# Patient Record
Sex: Female | Born: 1935 | Race: Black or African American | Hispanic: No | Marital: Single | Smoking: Former smoker
Health system: Southern US, Community
[De-identification: ages and names within clinical notes are randomized; demographics above are authoritative.]

## PROBLEM LIST (undated history)

## (undated) DIAGNOSIS — I2510 Atherosclerotic heart disease of native coronary artery without angina pectoris: Secondary | ICD-10-CM

## (undated) DIAGNOSIS — M545 Low back pain, unspecified: Secondary | ICD-10-CM

## (undated) DIAGNOSIS — M1990 Unspecified osteoarthritis, unspecified site: Secondary | ICD-10-CM

## (undated) DIAGNOSIS — N259 Disorder resulting from impaired renal tubular function, unspecified: Secondary | ICD-10-CM

## (undated) DIAGNOSIS — R51 Headache: Secondary | ICD-10-CM

## (undated) DIAGNOSIS — K589 Irritable bowel syndrome without diarrhea: Secondary | ICD-10-CM

## (undated) DIAGNOSIS — F0390 Unspecified dementia without behavioral disturbance: Secondary | ICD-10-CM

## (undated) DIAGNOSIS — G510 Bell's palsy: Secondary | ICD-10-CM

## (undated) DIAGNOSIS — K5730 Diverticulosis of large intestine without perforation or abscess without bleeding: Secondary | ICD-10-CM

## (undated) DIAGNOSIS — K449 Diaphragmatic hernia without obstruction or gangrene: Secondary | ICD-10-CM

## (undated) DIAGNOSIS — I10 Essential (primary) hypertension: Secondary | ICD-10-CM

## (undated) DIAGNOSIS — D649 Anemia, unspecified: Secondary | ICD-10-CM

## (undated) DIAGNOSIS — G459 Transient cerebral ischemic attack, unspecified: Secondary | ICD-10-CM

## (undated) DIAGNOSIS — E119 Type 2 diabetes mellitus without complications: Secondary | ICD-10-CM

## (undated) HISTORY — DX: Type 2 diabetes mellitus without complications: E11.9

## (undated) HISTORY — DX: Diverticulosis of large intestine without perforation or abscess without bleeding: K57.30

## (undated) HISTORY — DX: Atherosclerotic heart disease of native coronary artery without angina pectoris: I25.10

## (undated) HISTORY — PX: HIATAL HERNIA REPAIR: SHX195

## (undated) HISTORY — DX: Anemia, unspecified: D64.9

## (undated) HISTORY — DX: Unspecified osteoarthritis, unspecified site: M19.90

## (undated) HISTORY — DX: Transient cerebral ischemic attack, unspecified: G45.9

## (undated) HISTORY — DX: Irritable bowel syndrome without diarrhea: K58.9

## (undated) HISTORY — DX: Bell's palsy: G51.0

## (undated) HISTORY — DX: Diaphragmatic hernia without obstruction or gangrene: K44.9

## (undated) HISTORY — DX: Essential (primary) hypertension: I10

## (undated) HISTORY — DX: Unspecified dementia, unspecified severity, without behavioral disturbance, psychotic disturbance, mood disturbance, and anxiety: F03.90

## (undated) HISTORY — DX: Headache: R51

## (undated) HISTORY — DX: Disorder resulting from impaired renal tubular function, unspecified: N25.9

## (undated) HISTORY — DX: Low back pain: M54.5

## (undated) HISTORY — PX: CHOLECYSTECTOMY: SHX55

## (undated) HISTORY — DX: Low back pain, unspecified: M54.50

---

## 1997-09-22 ENCOUNTER — Emergency Department (HOSPITAL_COMMUNITY): Admission: EM | Admit: 1997-09-22 | Discharge: 1997-09-22 | Payer: Self-pay | Admitting: Internal Medicine

## 1998-04-05 ENCOUNTER — Inpatient Hospital Stay (HOSPITAL_COMMUNITY): Admission: AD | Admit: 1998-04-05 | Discharge: 1998-04-06 | Payer: Self-pay | Admitting: Cardiology

## 1999-11-28 ENCOUNTER — Emergency Department (HOSPITAL_COMMUNITY): Admission: EM | Admit: 1999-11-28 | Discharge: 1999-11-29 | Payer: Self-pay | Admitting: Emergency Medicine

## 1999-11-28 ENCOUNTER — Encounter: Payer: Self-pay | Admitting: Emergency Medicine

## 1999-11-29 ENCOUNTER — Encounter: Payer: Self-pay | Admitting: Emergency Medicine

## 2004-02-12 ENCOUNTER — Encounter: Admission: RE | Admit: 2004-02-12 | Discharge: 2004-03-10 | Payer: Self-pay | Admitting: Orthopedic Surgery

## 2004-05-17 ENCOUNTER — Ambulatory Visit: Payer: Self-pay | Admitting: Pulmonary Disease

## 2004-06-14 ENCOUNTER — Ambulatory Visit: Payer: Self-pay | Admitting: Pulmonary Disease

## 2004-08-29 ENCOUNTER — Ambulatory Visit: Payer: Self-pay | Admitting: Pulmonary Disease

## 2004-10-04 ENCOUNTER — Ambulatory Visit: Payer: Self-pay | Admitting: Pulmonary Disease

## 2005-07-04 ENCOUNTER — Ambulatory Visit: Payer: Self-pay | Admitting: Pulmonary Disease

## 2005-07-04 ENCOUNTER — Ambulatory Visit (HOSPITAL_COMMUNITY): Admission: RE | Admit: 2005-07-04 | Discharge: 2005-07-04 | Payer: Self-pay | Admitting: Pulmonary Disease

## 2005-07-25 ENCOUNTER — Ambulatory Visit: Payer: Self-pay | Admitting: Pulmonary Disease

## 2005-09-27 ENCOUNTER — Encounter: Admission: RE | Admit: 2005-09-27 | Discharge: 2005-09-27 | Payer: Self-pay | Admitting: Pulmonary Disease

## 2005-10-03 ENCOUNTER — Ambulatory Visit: Payer: Self-pay | Admitting: Pulmonary Disease

## 2005-11-13 ENCOUNTER — Ambulatory Visit: Payer: Self-pay | Admitting: Pulmonary Disease

## 2006-03-29 ENCOUNTER — Ambulatory Visit: Payer: Self-pay | Admitting: Pulmonary Disease

## 2006-05-30 ENCOUNTER — Ambulatory Visit: Payer: Self-pay | Admitting: Pulmonary Disease

## 2006-05-30 LAB — CONVERTED CEMR LAB
BUN: 10 mg/dL (ref 6–23)
CO2: 23 meq/L (ref 19–32)
GFR calc Af Amer: 61 mL/min
GFR calc non Af Amer: 51 mL/min
Potassium: 3.1 meq/L — ABNORMAL LOW (ref 3.5–5.1)

## 2006-12-26 ENCOUNTER — Ambulatory Visit: Payer: Self-pay | Admitting: Pulmonary Disease

## 2006-12-26 LAB — CONVERTED CEMR LAB
BUN: 39 mg/dL — ABNORMAL HIGH (ref 6–23)
CO2: 23 meq/L (ref 19–32)
GFR calc Af Amer: 20 mL/min
GFR calc non Af Amer: 17 mL/min
Glucose, Bld: 146 mg/dL — ABNORMAL HIGH (ref 70–99)
Potassium: 3.5 meq/L (ref 3.5–5.1)

## 2007-01-07 ENCOUNTER — Ambulatory Visit: Payer: Self-pay | Admitting: Pulmonary Disease

## 2007-01-07 LAB — CONVERTED CEMR LAB
BUN: 15 mg/dL (ref 6–23)
CO2: 26 meq/L (ref 19–32)
GFR calc Af Amer: 61 mL/min
Glucose, Bld: 204 mg/dL — ABNORMAL HIGH (ref 70–99)
Potassium: 3.6 meq/L (ref 3.5–5.1)

## 2007-03-13 DIAGNOSIS — M545 Low back pain: Secondary | ICD-10-CM

## 2007-03-13 DIAGNOSIS — I2510 Atherosclerotic heart disease of native coronary artery without angina pectoris: Secondary | ICD-10-CM

## 2007-03-13 DIAGNOSIS — K449 Diaphragmatic hernia without obstruction or gangrene: Secondary | ICD-10-CM | POA: Insufficient documentation

## 2007-03-13 DIAGNOSIS — I10 Essential (primary) hypertension: Secondary | ICD-10-CM | POA: Insufficient documentation

## 2007-03-13 DIAGNOSIS — E7800 Pure hypercholesterolemia, unspecified: Secondary | ICD-10-CM

## 2007-03-13 DIAGNOSIS — K589 Irritable bowel syndrome without diarrhea: Secondary | ICD-10-CM

## 2007-03-13 DIAGNOSIS — D649 Anemia, unspecified: Secondary | ICD-10-CM | POA: Insufficient documentation

## 2007-03-13 DIAGNOSIS — M1990 Unspecified osteoarthritis, unspecified site: Secondary | ICD-10-CM

## 2007-03-13 DIAGNOSIS — Z87898 Personal history of other specified conditions: Secondary | ICD-10-CM

## 2007-03-13 DIAGNOSIS — G459 Transient cerebral ischemic attack, unspecified: Secondary | ICD-10-CM | POA: Insufficient documentation

## 2007-03-14 ENCOUNTER — Ambulatory Visit: Payer: Self-pay | Admitting: Pulmonary Disease

## 2007-03-22 ENCOUNTER — Ambulatory Visit (HOSPITAL_COMMUNITY): Admission: RE | Admit: 2007-03-22 | Discharge: 2007-03-22 | Payer: Self-pay | Admitting: Pulmonary Disease

## 2007-04-29 ENCOUNTER — Encounter: Payer: Self-pay | Admitting: Pulmonary Disease

## 2007-07-11 ENCOUNTER — Encounter: Payer: Self-pay | Admitting: Pulmonary Disease

## 2007-07-12 ENCOUNTER — Encounter: Payer: Self-pay | Admitting: Pulmonary Disease

## 2007-07-16 ENCOUNTER — Ambulatory Visit: Payer: Self-pay | Admitting: Pulmonary Disease

## 2007-07-16 DIAGNOSIS — K5730 Diverticulosis of large intestine without perforation or abscess without bleeding: Secondary | ICD-10-CM

## 2007-07-16 DIAGNOSIS — N259 Disorder resulting from impaired renal tubular function, unspecified: Secondary | ICD-10-CM | POA: Insufficient documentation

## 2007-07-16 DIAGNOSIS — F0390 Unspecified dementia without behavioral disturbance: Secondary | ICD-10-CM

## 2007-08-04 LAB — CONVERTED CEMR LAB
ALT: 14 units/L (ref 0–35)
AST: 26 units/L (ref 0–37)
Basophils Absolute: 0 10*3/uL (ref 0.0–0.1)
Bilirubin, Direct: 0.1 mg/dL (ref 0.0–0.3)
CO2: 27 meq/L (ref 19–32)
Chloride: 111 meq/L (ref 96–112)
Lymphocytes Relative: 31.8 % (ref 12.0–46.0)
MCHC: 32.7 g/dL (ref 30.0–36.0)
Neutrophils Relative %: 58.4 % (ref 43.0–77.0)
Potassium: 3.5 meq/L (ref 3.5–5.1)
RDW: 12.6 % (ref 11.5–14.6)
Sodium: 145 meq/L (ref 135–145)
TSH: 1.31 microintl units/mL (ref 0.35–5.50)
Total Bilirubin: 0.7 mg/dL (ref 0.3–1.2)

## 2007-08-23 ENCOUNTER — Encounter: Payer: Self-pay | Admitting: Pulmonary Disease

## 2008-01-03 ENCOUNTER — Telehealth (INDEPENDENT_AMBULATORY_CARE_PROVIDER_SITE_OTHER): Payer: Self-pay | Admitting: *Deleted

## 2008-01-03 DIAGNOSIS — R32 Unspecified urinary incontinence: Secondary | ICD-10-CM

## 2008-01-20 ENCOUNTER — Encounter: Payer: Self-pay | Admitting: Pulmonary Disease

## 2008-01-22 ENCOUNTER — Encounter: Payer: Self-pay | Admitting: Adult Health

## 2008-01-22 ENCOUNTER — Ambulatory Visit: Payer: Self-pay | Admitting: Pulmonary Disease

## 2008-01-22 LAB — CONVERTED CEMR LAB
Ketones, ur: NEGATIVE mg/dL
Mucus, UA: NEGATIVE
RBC / HPF: NONE SEEN
Specific Gravity, Urine: >=1.03 (ref 1.000–1.03)
pH: 6 (ref 5.0–8.0)

## 2008-02-05 ENCOUNTER — Encounter: Payer: Self-pay | Admitting: Adult Health

## 2008-02-28 ENCOUNTER — Telehealth (INDEPENDENT_AMBULATORY_CARE_PROVIDER_SITE_OTHER): Payer: Self-pay | Admitting: *Deleted

## 2008-03-03 ENCOUNTER — Ambulatory Visit: Payer: Self-pay | Admitting: Internal Medicine

## 2008-03-17 ENCOUNTER — Encounter: Payer: Self-pay | Admitting: Pulmonary Disease

## 2008-04-21 ENCOUNTER — Inpatient Hospital Stay (HOSPITAL_COMMUNITY): Admission: EM | Admit: 2008-04-21 | Discharge: 2008-04-22 | Payer: Self-pay | Admitting: Emergency Medicine

## 2008-04-21 ENCOUNTER — Ambulatory Visit: Payer: Self-pay | Admitting: Pulmonary Disease

## 2008-04-27 ENCOUNTER — Telehealth: Payer: Self-pay | Admitting: Pulmonary Disease

## 2008-05-01 ENCOUNTER — Ambulatory Visit: Payer: Self-pay | Admitting: Pulmonary Disease

## 2008-05-01 DIAGNOSIS — G510 Bell's palsy: Secondary | ICD-10-CM

## 2008-05-11 ENCOUNTER — Telehealth (INDEPENDENT_AMBULATORY_CARE_PROVIDER_SITE_OTHER): Payer: Self-pay | Admitting: *Deleted

## 2008-06-04 ENCOUNTER — Telehealth: Payer: Self-pay | Admitting: Pulmonary Disease

## 2008-06-04 ENCOUNTER — Encounter: Payer: Self-pay | Admitting: Pulmonary Disease

## 2008-07-02 ENCOUNTER — Telehealth (INDEPENDENT_AMBULATORY_CARE_PROVIDER_SITE_OTHER): Payer: Self-pay | Admitting: *Deleted

## 2008-07-07 ENCOUNTER — Encounter: Payer: Self-pay | Admitting: Pulmonary Disease

## 2008-07-20 ENCOUNTER — Encounter: Payer: Self-pay | Admitting: Pulmonary Disease

## 2008-08-12 ENCOUNTER — Encounter: Payer: Self-pay | Admitting: Pulmonary Disease

## 2008-10-29 ENCOUNTER — Encounter: Payer: Self-pay | Admitting: Pulmonary Disease

## 2008-12-15 ENCOUNTER — Ambulatory Visit: Payer: Self-pay | Admitting: Pulmonary Disease

## 2008-12-16 ENCOUNTER — Telehealth: Payer: Self-pay | Admitting: Pulmonary Disease

## 2008-12-16 ENCOUNTER — Emergency Department (HOSPITAL_COMMUNITY): Admission: EM | Admit: 2008-12-16 | Discharge: 2008-12-17 | Payer: Self-pay | Admitting: Emergency Medicine

## 2009-07-09 ENCOUNTER — Encounter (INDEPENDENT_AMBULATORY_CARE_PROVIDER_SITE_OTHER): Payer: Self-pay | Admitting: *Deleted

## 2009-07-27 ENCOUNTER — Ambulatory Visit: Payer: Self-pay | Admitting: Pulmonary Disease

## 2009-07-27 LAB — CONVERTED CEMR LAB
ALT: 13 units/L (ref 0–35)
BUN: 16 mg/dL (ref 6–23)
Basophils Relative: 0.5 % (ref 0.0–3.0)
Chloride: 113 meq/L — ABNORMAL HIGH (ref 96–112)
Eosinophils Relative: 1.5 % (ref 0.0–5.0)
HCT: 34.6 % — ABNORMAL LOW (ref 36.0–46.0)
Hemoglobin: 11.8 g/dL — ABNORMAL LOW (ref 12.0–15.0)
Hgb A1c MFr Bld: 5.4 % (ref 4.6–6.5)
Lymphs Abs: 1.5 10*3/uL (ref 0.7–4.0)
MCV: 92 fL (ref 78.0–100.0)
Monocytes Absolute: 0.3 10*3/uL (ref 0.1–1.0)
Neutro Abs: 3.3 10*3/uL (ref 1.4–7.7)
Potassium: 3.4 meq/L — ABNORMAL LOW (ref 3.5–5.1)
RBC: 3.76 M/uL — ABNORMAL LOW (ref 3.87–5.11)
Sodium: 148 meq/L — ABNORMAL HIGH (ref 135–145)
TSH: 1.22 microintl units/mL (ref 0.35–5.50)
Total Protein: 7 g/dL (ref 6.0–8.3)
WBC: 5.2 10*3/uL (ref 4.5–10.5)

## 2010-01-26 ENCOUNTER — Ambulatory Visit: Payer: Self-pay | Admitting: Pulmonary Disease

## 2010-02-02 LAB — CONVERTED CEMR LAB
BUN: 19 mg/dL (ref 6–23)
Calcium: 9.9 mg/dL (ref 8.4–10.5)
Creatinine, Ser: 0.8 mg/dL (ref 0.4–1.2)
Hgb A1c MFr Bld: 5.8 % (ref 4.6–6.5)

## 2010-03-03 ENCOUNTER — Encounter: Payer: Self-pay | Admitting: Pulmonary Disease

## 2010-03-18 ENCOUNTER — Telehealth (INDEPENDENT_AMBULATORY_CARE_PROVIDER_SITE_OTHER): Payer: Self-pay | Admitting: *Deleted

## 2010-05-24 NOTE — Assessment & Plan Note (Signed)
Summary: rov/ 6 months/ mss   CC:  21 month ROV & review of mult medical problems....  History of Present Illness: 77 y/o BF here for a follow up visit... she has mult med problems as noted below...    ~  Jan10:  she has no specific complaints or concerns- "I'm doing OK"... she has a severe dementia- stable... family notes that she complains of HA on occas, then forgets about it... she was adm to observ 12/29 after presenting w/ right facial symptoms that ER doc thought was TIA... eval revealed prob Bell's Palsey & confirmed by DrWillis... she was treated w/ Acyclovir orally and returns for f/u assessment...  she is improved after the Acyclovir Rx... min residual right facial weakness noted w/ smile etc... eye closes completely etc... sensation appears intact...   ~  January 26, 2010:  21 mo ROV- but saw NP 4/11 for refill meds, doing well... they have home health nurse 5d per week for 7H- requires assist w/ all ADLs, she is incontinent, still feeds self, no behavioral issues...  BP controlled on meds; no CP, palpit, ch in SOB, etc;  DM control a bit tight on her Glucovance & we will decr to 1/2 tab in AM only;  uses Tramadol for arthritic complaints...     Current Problems:    STRABISMUS - she has a remarkable strabismus, but vision seems adeq per family hx & given her severe underlying dementia they have decided not to pursue this prob w/ ophthalmology.  HYPERTENSION (ICD-401.9) - on LABETOLOL 200mg Bid, NORVASC 10mg /d, off Lasix & KCl...  ~  BP today = 140/80 & tolerates meds well... denies HA, fatigue, visual changes, CP, palipit, dizziness, syncope, dyspnea, edema, etc... but as noted she has dementia and denies all symptoms.  CAD (ICD-414.00)  ~  Persantine Cardiolite 3/98 was norm w/o ischemia or infarct, EF=72%...  ~  cath 12/99 showed Ao root dil, 2 vessel non-obstructive CAD w/ 20-30% lesions, EF=70%...  HYPERCHOLESTEROLEMIA (ICD-272.0) - prev on Zetia, now on diet alone...  family  unable to bring her into the office for FASTING blood work.  DM (ICD-250.00) - on GLUCOVANCE 2.5-500 Bid... + low carb/ no sweets diet...  ~  labs 3/09 showed BS= 96, HgA1c= 6.0.  ~  labs 12/09 in hosp showed BS= 122, HgA1c= 6.2.  ~  labs 10/11 showed BS= 106, A1c= 5.8.Marland KitchenMarland Kitchen this is prob too tight control & rec to decr Glucovance to 1/2 tab Qam only.  HIATAL HERNIA (ICD-553.3) - no longer taking her PPI therapy- prev Nexium Rx...  ~  last EGD was 11/96 by DrBrodie showing deformed pylorus c/w chr PUD, some gastric retension.  ~  Ba swallow 11/08 was essent neg- sm HH, no reflux...  DIVERTICULOSIS OF COLON (ICD-562.10) - prev Rx w/ Levsin, Metamucil...  ~  last colonoscopy was 2/04 by DrBrodie showing divertics, no polyps...   IRRITABLE BOWEL SYNDROME (ICD-564.1)  Hx of RENAL INSUFFICIENCY (ICD-588.9)  ~  labs 3/09 showed BUN= 15, Creat= 0.8  ~  labs 12/09 showed BUN= 23, Creat= 0.88  ~  labs 10/11 showed BUN= 19, Creat= 0.8  DEGENERATIVE JOINT DISEASE (ICD-715.90) - on TRAMADOL Prn...  BACK PAIN, LUMBAR (ICD-724.2)  TIA (ICD-435.9) - on ASA 81mg /d...   ~  CT Brain 12/09 showed atherosclerotic changes, atrophy, NAD...  Hx of HEADACHE (ICD-784.0) -  prev Rx w/ on Neurontin 300Qhs... eval by DrWillis 8/08...   SENILE DEMENTIA (ICD-290.0) - on ARICEPT 10mg /d & NAMENDA 10mg Bid... family cares for  pt at home and they do a beautiful job... they have to put meds in her food otherw she won't take them...  Hx of BELL'S PALSY, RIGHT (ICD-351.0) - ** see above ** Hosp overnight 04/21/08 w/ right facial weakness c/w Bell's Palsey... Rx w/ Acyclovir and improved...  SHINGLES, HX OF (ICD-V13.8)  ANEMIA (ICD-285.9)   Preventive Screening-Counseling & Management  Alcohol-Tobacco     Smoking Status: quit  Comments: quit date unknown per pt---she did smoke 1/2 ppd  Allergies: 1)  ! Lipitor 2)  ! * Actos  Comments:  Nurse/Medical Assistant: The patient's medications and allergies were  reviewed with the patient and were updated in the Medication and Allergy Lists.  Past History:  Past Medical History: HYPERTENSION (ICD-401.9) CAD (ICD-414.00) HYPERCHOLESTEROLEMIA (ICD-272.0) DM (ICD-250.00) HIATAL HERNIA (ICD-553.3) DIVERTICULOSIS OF COLON (ICD-562.10) IRRITABLE BOWEL SYNDROME (ICD-564.1) Hx of RENAL INSUFFICIENCY (ICD-588.9) DEGENERATIVE JOINT DISEASE (ICD-715.90) BACK PAIN, LUMBAR (ICD-724.2) TIA (ICD-435.9) Hx of HEADACHE (ICD-784.0) SENILE DEMENTIA (ICD-290.0) Hx of BELL'S PALSY, RIGHT (ICD-351.0) ANEMIA (ICD-285.9) SHINGLES, HX OF (ICD-V13.8)  Past Surgical History: S/P cholecystectomy  Family History: Reviewed history from 07/27/2009 and no changes required. mother died of CVA age 99 father died of unknown cause 2 brothers otherwise significant for hypertension, diabetes and heart disease  Social History: Reviewed history from 07/27/2009 and no changes required. lives at home, w/ son 9 children no alcohol  former smoker of 1/2ppd, quit date unknown widowed high school education  Smoking Status:  quit  Review of Systems      See HPI       The patient complains of decreased hearing, dyspnea on exertion, muscle weakness, and difficulty walking.  The patient denies anorexia, fever, weight loss, weight gain, vision loss, hoarseness, chest pain, syncope, peripheral edema, prolonged cough, headaches, hemoptysis, abdominal pain, melena, hematochezia, severe indigestion/heartburn, hematuria, incontinence, suspicious skin lesions, transient blindness, depression, unusual weight change, abnormal bleeding, enlarged lymph nodes, and angioedema.    Vital Signs:  Patient profile:   77 year old female Height:      60 inches O2 Sat:      96 % on Room air Temp:     97.7 degrees F oral Pulse rate:   60 / minute BP sitting:   140 / 80  (left arm) Cuff size:   regular  Vitals Entered By: Randell Loop CMA (January 26, 2010 11:13 AM)  O2 Sat at Rest %:   96 O2 Flow:  Room air CC: 21 month ROV & review of mult medical problems... Is Patient Diabetic? Yes Pain Assessment Patient in pain? yes      Onset of pain  lower abd pain at times Comments no changes in meds today---pts caregiver brought all of her meds today   Physical Exam  Additional Exam:  WD, Overweight, 77 y/o BF in NAD... GENERAL:  Alert & cooperative... she has an obvious dementia... HEENT:  [place]/AT, marked strabismus, EACs-cerumne bilaterlly,  NOSE-clear, THROAT-clear & wnl. Min residual right facial weakness seen... NECK:  Supple w/ fair ROM; no JVD; normal carotid impulses w/o bruits; no thyromegaly or nodules palpated; no lymphadenopathy. CHEST:  Clear to P & A; without wheezes/ rales/ or rhonchi. HEART:  Regular Rhythm; without murmurs/ rubs/ or gallops. ABD:  obese, soft, non-tender;  no organomegaly or masses palpated... EXT: without deformities, mod arthritic changes; no varicose veins/ +venous insuffic/ no edema. NEURO: pleasantly confused DERM:  No lesions noted; no rash etc...    MISC. Report  Procedure date:  01/26/2010  Findings:      BMP (METABOL)   Sodium               [H]  147 mEq/L                   135-145   Potassium                 3.8 mEq/L                   3.5-5.1   Chloride                  112 mEq/L                   96-112   Carbon Dioxide            28 mEq/L                    19-32   Glucose              [H]  106 mg/dL                   81-19   BUN                       19 mg/dL                    1-47   Creatinine                0.8 mg/dL                   8.2-9.5   Calcium                   9.9 mg/dL                   6.2-13.0   GFR                       93.31 mL/min                >60   Hemoglobin A1C (A1C)   Hemoglobin A1C            5.8 %                       4.6-6.5   Impression & Recommendations:  Problem # 1:  HYPERTENSION (ICD-401.9) Controlled>  same meds, family administers... Her updated medication list for this  problem includes:    Labetalol Hcl 200 Mg Tabs (Labetalol hcl) .[name]... Take one talblet two times a day    Norvasc 10 Mg Tabs (Amlodipine besylate) .[name]... Take 1/2 tablet by mouth once daily  Orders: TLB-BMP (Basic Metabolic Panel-BMET) (80048-METABOL) TLB-A1C / Hgb A1C (Glycohemoglobin) (83036-A1C)  Problem # 2:  CAD (ICD-414.00) Very sedentary, but she has no complaints and denies CP/ palpit, etc... Her updated medication list for this problem includes:    Labetalol Hcl 200 Mg Tabs (Labetalol hcl) .[name]... Take one talblet two times a day    Norvasc 10 Mg Tabs (Amlodipine besylate) .[name]... Take 1/2 tablet by mouth once daily  Problem # 3:  HYPERCHOLESTEROLEMIA (ICD-272.0) On diet alone, but family is unable to get her to the office for fasting blood work...  Problem # 4:  DM (ICD-250.00) A1c is 5.8 today & control seems a little tight> therefore  ch to 1/2 Glucov Qam only... Her updated medication list for this problem includes:    Glyburide-metformin 2.5-500 Mg Tabs (Glyburide-metformin) .[name]... Take 1/2 tab by mouth daily w. breakfast...  Problem # 5:  DIVERTICULOSIS OF COLON (ICD-562.10) GI is stable>  no complaints...  Problem # 6:  DEGENERATIVE JOINT DISEASE (ICD-715.90) Stable on the Tramadol Prn... Her updated medication list for this problem includes:    Tramadol Hcl 50 Mg Tabs (Tramadol hcl) .[name]... Take 1 tablet by mouth four times a day as needed for athritis pain  Problem # 7:  SENILE DEMENTIA (ICD-290.0) Severe disease>  stable at home w/ home care + family assist...  Problem # 8:  OTHER MEDICAL PROBLEMS AS NOTED>>> OK Flu vaccine today..  Complete Medication List: 1)  Labetalol Hcl 200 Mg Tabs (Labetalol hcl) .... Take one talblet two times a day 2)  Norvasc 10 Mg Tabs (Amlodipine besylate) .... Take 1/2 tablet by mouth once daily 3)  Glyburide-metformin 2.5-500 Mg Tabs (Glyburide-metformin) .... Take 1/2 tab by mouth daily w. breakfast... 4)  Aricept 10 Mg Tabs (Donepezil  hcl) .... Take 1 tablet by mouth once a day 5)  Namenda 10 Mg Tabs (Memantine hcl) .[name].. 1 tab by mouth two times a day as directed... 6)  Tramadol Hcl 50 Mg Tabs (Tramadol hcl) .... Take 1 tablet by mouth four times a day as needed for athritis pain  Other Orders: Flu Vaccine 9yrs + MEDICARE PATIENTS (W0981) Administration Flu vaccine - MCR (X9147)  Patient Instructions: 1)  Today we updated your med list- see below.... 2)  Continue your current medsas listed below... 3)  Today we checked your Diabetic labs... please call the "phone tree" in a few days for your lab results.Marland KitchenMarland Kitchen 4)  We also gave you the 2011 Flu vaccine... 5)  Call for any problems.Marland KitchenMarland Kitchen 6)  Please schedule a follow-up appointment in 6 months.      Flu Vaccine Consent Questions     Do you have a history of severe allergic reactions to this vaccine? no    Any prior history of allergic reactions to egg and/or gelatin? no    Do you have a sensitivity to the preservative Thimersol? no    Do you have a past history of Guillan-Barre Syndrome? no    Do you currently have an acute febrile illness? no    Have you ever had a severe reaction to latex? no    Vaccine information given and explained to patient? yes    Are you currently pregnant? no    Lot Number:[id number]   Exp Date:10/22/2010   Site Given  Left Deltoid IMflu   Randell Loop CMA  January 26, 2010 12:57 PM

## 2010-05-24 NOTE — Progress Notes (Signed)
Summary: pending surgery/ med POA -  Phone Note Call from Patient   Caller: Daughter-romania mcdaniel Call For: nadel Summary of Call: pt is to have cataract surgery next month. wants to know if there is anything dr Kriste Basque needs to sign re: med power of atty (for daughter). [phone] Initial call taken by: Tivis Ringer, CNA,  March 18, 2010 12:16 PM  Follow-up for Phone Call        called spoke with [name] who states that pt's opthalmologist had mentioned to her brother about pt needing to have a medical POA.  [name] is asking if pt has documentation to state whether she is the POA or medical POA.  nothing in EMR.  informed [name] that i will order her paper chart.  [name] verbalized her understanding and stated that she will call the opthalmologist for clarification.  [name] is aware that regarding POA's, she must contact their lawyer.  paper chart ordered. Boone Master CNA/MA  March 18, 2010 12:30 PM   Additional Follow-up for Phone Call Additional follow up Details #1::        There is no medical POA in the pt's paper chart and the daughter is aware. Daughter, [name],  understands that she will need to contact her lawyers office if this is something she is needing. Additional Follow-up by: Michel Bickers CMA,  March 21, 2010 8:52 AM

## 2010-05-24 NOTE — Letter (Signed)
Summary: Colonoscopy-Changed to Office Visit Letter  [facility]  [address] [place], [place] [address]   Phone: [phone]  Fax: [phone]      July 09, 2009 MRN: [id number]   Jadia Raiche [address] [place], [place]  [address]   Dear Ms. Nicasio,   According to our records, it is time for you to schedule a Colonoscopy. However, after reviewing your medical record, I feel that an office visit would be most appropriate to more completely evaluate you and determine your need for a repeat procedure.  Please call [phone] (option #2) at your convenience to schedule an office visit. If you have any questions, concerns, or feel that this letter is in error, we would appreciate your call.   Sincerely,  Hedwig Morton. Juanda Chance, M.D.  [facility] [phone]

## 2010-05-24 NOTE — Assessment & Plan Note (Signed)
Summary: NP follow up - med refill   CC:  follow up for refill on norvasc.  no complaints today per caregiver.Sonya Price  History of Present Illness: 77  y/o BF history of  mod dementia on AriceptNamenda.   January 22, 2008 --Presents with c/o of back pain, increased urination, care giver states she has been more lethargic than usual x2-3 weeks. Concerned of UTI, has had in past.  --tx with cipro (urine cx inconclusive)  03/03/08--Presents for decreased hearing, ear fullness. concerned something is in her ears. Denies chest pain, dyspnea, orthopnea, hemoptysis, fever, n/v/d, edema, injury.   77 y/o BF here for a follow up visit... she has mult med problems as noted below... she has no specific complaints or concerns- "I'm doing OK"... she has a severe dementia- stable... family notes that she complains of HA on occas, then forgets about it... she was adm to observ 12/29 after presenting w/ right facial symptoms that ER doc thought was TIA... eval revealed prob Bell's Palsey & confirmed by DrWillis... she was treated w/ Acyclovir orally and returns for f/u assessment...   ~  May 01, 2008:  she is improved after the Acyclovir Rx... min residual right facial weakness noted w/ smile etc... eye closes completely etc... sensation appears intact...  July 27, 2009- Presents today for follow up and med refills. Home Health nurse 5 days /week for 7 hr /d. She lives with son,  family helps. Requires assistance w/ ADLs, bathing, dressing. She is incontinent. she can feed herself. No agitation. Very calm and pleasant. Main problem is arthritis in back, tramadol helps. BS doing well, no hypoglycemic episodes. Denies chest pain, dyspnea, orthopnea, hemoptysis, fever, n/v/d, edema, headache.   Medications Prior to Update: 1)  Aspirin 81 Mg Tbec (Aspirin) .... Take 1 Tab By Mouth Once Daily.Sonya KitchenMarland Price 2)  Labetalol Hcl 200 Mg  Tabs (Labetalol Hcl) .... Take One Talblet Two Times A Day 3)  Norvasc 10 Mg Tabs (Amlodipine  Besylate) .... Take 1/2 Tablet By Mouth Once Daily 4)  Glyburide-Metformin 2.5-500 Mg  Tabs (Glyburide-Metformin) .... Take One Tablet Two Times A Day 5)  Aricept 10 Mg  Tabs (Donepezil Hcl) .... Take 1 Tablet By Mouth Once A Day 6)  Namenda 10 Mg  Tabs (Memantine Hcl) .Sonya Price.. 1 Tab By Mouth Two Times A Day As Directed... 7)  Gabapentin 300 Mg  Tabs (Gabapentin) .... Take One Tablet At Bedtime 8)  Ambien 10 Mg  Tabs (Zolpidem Tartrate) .... Take One Tablet As Needed At Bedtime 9)  Tramadol Hcl 50 Mg Tabs (Tramadol Hcl) .... Take 1 Tablet By Mouth Four Times A Day As Needed For Athritis Pain  Current Medications (verified): 1)  Labetalol Hcl 200 Mg  Tabs (Labetalol Hcl) .... Take One Talblet Two Times A Day 2)  Norvasc 10 Mg Tabs (Amlodipine Besylate) .... Take 1/2 Tablet By Mouth Once Daily 3)  Glyburide-Metformin 2.5-500 Mg  Tabs (Glyburide-Metformin) .... Take One Tablet Two Times A Day 4)  Aricept 10 Mg  Tabs (Donepezil Hcl) .... Take 1 Tablet By Mouth Once A Day 5)  Namenda 10 Mg  Tabs (Memantine Hcl) .Sonya Price.. 1 Tab By Mouth Two Times A Day As Directed... 6)  Tramadol Hcl 50 Mg Tabs (Tramadol Hcl) .... Take 1 Tablet By Mouth Four Times A Day As Needed For Athritis Pain  Allergies (verified): 1)  ! Lipitor 2)  ! * Actos  Past History:  Past Medical History: Last updated: 05/01/2008  HYPERTENSION (ICD-401.9) CAD (ICD-414.00) HYPERCHOLESTEROLEMIA (  ICD-272.0) DM (ICD-250.00) HIATAL HERNIA (ICD-553.3) DIVERTICULOSIS OF COLON (ICD-562.10) IRRITABLE BOWEL SYNDROME (ICD-564.1) Hx of RENAL INSUFFICIENCY (ICD-588.9) DEGENERATIVE JOINT DISEASE (ICD-715.90) BACK PAIN, LUMBAR (ICD-724.2) TIA (ICD-435.9) Hx of HEADACHE (ICD-784.0) SENILE DEMENTIA (ICD-290.0) Hx of BELL'S PALSY, RIGHT (ICD-351.0) ANEMIA (ICD-285.9) SHINGLES, HX OF (ICD-V13.8)  Past Surgical History: Last updated: 05/01/2008 S/P cholecystectomy  Social History: Last updated: 07/27/2009 lives at home, w/ son  Risk  Factors: Smoking Status: never (05/01/2008)  Social History: lives at home, w/ son  Review of Systems      See HPI  Vital Signs:  Patient profile:   77 year old female Height:      60 inches Weight:      145 pounds BMI:     28.42 O2 Sat:      97 % on Room air Temp:     97.0 degrees F oral Pulse rate:   83 / minute BP sitting:   118 / 72  (right arm) Cuff size:   regular  Vitals Entered By: Boone Master CNA (July 27, 2009 11:10 AM)  O2 Flow:  Room air CC: follow up for refill on norvasc.  no complaints today per caregiver. Is Patient Diabetic? Yes Comments Medications reviewed with patient Daytime contact number verified with patient. Boone Master CNA  July 27, 2009 11:11 AM    Physical Exam  Additional Exam:  WD, Overweight, 77 y/o BF in NAD... GENERAL:  Alert & cooperative... she has an obvious dementia... HEENT:  [place]/AT, EOM-wnl, PERRLA, EACs-cerumne bilaterlly,  NOSE-clear, THROAT-clear & wnl. Min residual right facial weakness seen... NECK:  Supple w/ fair ROM; no JVD; normal carotid impulses w/o bruits; no thyromegaly or nodules palpated; no lymphadenopathy. CHEST:  Clear to P & A; without wheezes/ rales/ or rhonchi. HEART:  Regular Rhythm; without murmurs/ rubs/ or gallops. ABD:  obese, soft, non-tender;  no organomegaly or masses palpated... EXT: without deformities, mod arthritic changes; no varicose veins/ +venous insuffic/ no edema. NEURO: pleasantly confused DERM:  No lesions noted; no rash etc...     Impression & Recommendations:  Problem # 1:  HYPERTENSION (ICD-401.9) Controlled on rx  Her updated medication list for this problem includes:    Labetalol Hcl 200 Mg Tabs (Labetalol hcl) .Sonya Price... Take one talblet two times a day    Norvasc 10 Mg Tabs (Amlodipine besylate) .Sonya Price... Take 1/2 tablet by mouth once daily  Orders: TLB-BMP (Basic Metabolic Panel-BMET) (80048-METABOL) TLB-CBC Platelet - w/Differential (85025-CBCD) TLB-Hepatic/Liver Function  Pnl (80076-HEPATIC) TLB-TSH (Thyroid Stimulating Hormone) (84443-TSH) Est. Patient Level IV (25366)  Problem # 2:  DM (ICD-250.00) will check labs  The following medications were removed from the medication list:    Aspirin 81 Mg Tbec (Aspirin) .Sonya Price... Take 1 tab by mouth once daily... Her updated medication list for this problem includes:    Glyburide-metformin 2.5-500 Mg Tabs (Glyburide-metformin) .Sonya Price... Take one tablet two times a day  Orders: TLB-A1C / Hgb A1C (Glycohemoglobin) (83036-A1C) Est. Patient Level IV (44034)  Problem # 3:  SENILE DEMENTIA (ICD-290.0)  cont to be pleasant w/ no agitation  Orders: Est. Patient Level IV (74259)  Complete Medication List: 1)  Labetalol Hcl 200 Mg Tabs (Labetalol hcl) .... Take one talblet two times a day 2)  Norvasc 10 Mg Tabs (Amlodipine besylate) .... Take 1/2 tablet by mouth once daily 3)  Glyburide-metformin 2.5-500 Mg Tabs (Glyburide-metformin) .... Take one tablet two times a day 4)  Aricept 10 Mg Tabs (Donepezil hcl) .... Take 1 tablet by mouth once  a day 5)  Namenda 10 Mg Tabs (Memantine hcl) .Sonya Price.. 1 tab by mouth two times a day as directed... 6)  Tramadol Hcl 50 Mg Tabs (Tramadol hcl) .... Take 1 tablet by mouth four times a day as needed for athritis pain  Patient Instructions: 1)  Continue on same meds.  2)  follow up Dr. Kriste Basque in 6 months.  3)  I will call with labs. - [phone] cell  Prescriptions: TRAMADOL HCL 50 MG TABS (TRAMADOL HCL) Take 1 tablet by mouth four times a day as needed for athritis pain  #100 Tablet x 3   Entered and Authorized by:   Rubye Oaks NP   Signed by:   Rubye Oaks NP on 07/27/2009   Method used:   Electronically to        [facility]  [address] [phone]* (retail)       [address]       [place], [place]  [id number]       Ph: [phone] or [phone]       Fax: [phone]   RxID:   [id number] NAMENDA 10 MG  TABS (MEMANTINE HCL) 1 tab by mouth two  times a day as directed...  #60 Tablet x 11   Entered and Authorized by:   Rubye Oaks NP   Signed by:   Rubye Oaks NP on 07/27/2009   Method used:   [facility] to        [facility]  [address] [phone]* (retail)       [address]       [place], [place]  [address]       Ph: [phone] or [phone]       Fax: [phone]   RxID:   [id number] ARICEPT 10 MG  TABS (DONEPEZIL HCL) Take 1 tablet by mouth once a day  #30 Tablet x 11   Entered and Authorized by:   Rubye Oaks NP   Signed by:   Rubye Oaks NP on 07/27/2009   Method used:   [facility]        [facility] [phone]* (retail)       [address]       [place], [place]  [id number]       Ph: [phone] or [phone]       Fax: [phone]   RxID:   [id number] GLYBURIDE-METFORMIN 2.5-500 MG  TABS (GLYBURIDE-METFORMIN) take one tablet two times a day  #60 x 11   Entered and Authorized by:   Rubye Oaks NP   Signed by:   Rubye Oaks NP on 07/27/2009   Method used:   [facility] to        [facility]  [address] [phone]* (retail)       [address]       [place], [place]  [address]       Ph: [phone] or [phone]       Fax: [phone]   RxID:   [id number] NORVASC 10 MG TABS (AMLODIPINE BESYLATE) take 1/2 tablet by mouth once daily  #30 x 11   Entered and Authorized by:   Rubye Oaks NP   Signed by:   Linsey Hirota NP on 07/27/2009   Method used:   [facility]  [facility]  [address] [phone]* (retail)       [address]       [place], [place]  [address]       Ph: [phone] or [phone]       Fax: [phone]   RxID:   [id number] LABETALOL HCL 200 MG  TABS (LABETALOL HCL) take one talblet two times a day  #60 Tablet x 11   Entered and Authorized by:   Rubye Oaks NP   Signed by:   Vedanshi Massaro NP on 07/27/2009   Method used:    [facility]        [facility]  [address] [phone]* (retail)       [address]       [place], [place]  [address]       Ph: [phone] or [phone]       Fax: [phone]   RxID:   [id number]    Orders Added: 1)  TLB-BMP (Basic Metabolic Panel-BMET) [80048-METABOL] 2)  TLB-CBC Platelet - w/Differential [85025-CBCD] 3)  TLB-Hepatic/Liver Function Pnl [80076-HEPATIC] 4)  TLB-TSH (Thyroid Stimulating Hormone) [84443-TSH] 5)  TLB-A1C / Hgb A1C (Glycohemoglobin) [83036-A1C] 6)  Est. Patient Level IV [27035]    Immunization History:  Influenza Immunization History:    Influenza:  historical (01/22/2009)  Pneumovax Immunization History:    Pneumovax:  historical (01/22/2009)  Appended Document: family/social history update     Allergies: 1)  ! Lipitor 2)  ! * Actos  Family History: mother died of CVA age 74 father died of unknown cause 2 brothers otherwise significant for hypertension, diabetes and heart disease  Social History: lives at home, w/ son 9 children no alcohol  former smoker of 1/2ppd, quit date unknown widowed high school education

## 2010-05-26 NOTE — Letter (Signed)
Summary: [facility]   Imported By: Lennie Odor 04/08/2010 14:25:55  _____________________________________________________________________  External Attachment:    Type:   Image     Comment:   External Document

## 2010-06-06 ENCOUNTER — Telehealth: Payer: Self-pay | Admitting: Internal Medicine

## 2010-06-15 ENCOUNTER — Telehealth: Payer: Self-pay | Admitting: Pulmonary Disease

## 2010-06-21 NOTE — Progress Notes (Signed)
Summary: knee swollen and very painful   Phone Note Call from Patient   Caller: DAUGHTER/ROMANIA Call For: Sonya Price Summary of Call: Patients daughter [name] phoned stated that she called yesterday and never received a call back. She stated that Ms Gants left knee is swollen, warm, and in terrible pain when she tries to move it. She stated that it appears to be fluid. She wants to know if she can be seen and maybe have the fluid drawn off. she can be reached at [phone]. Initial call taken by: Vedia Coffer,  June 15, 2010 9:35 AM  Follow-up for Phone Call        called and spoke with daughter [name] and she is aware that SN is out of the office today and TP could see her tomorrow afternoon but we dont draw fluid off on knees here in the office so daughter stated that it was ok to set pt up to see ortho for this---anyone was fine---order has been sent to the [facility] for the appt.  daughter is aware that someone will call her back with appt Randell Loop [facility]  June 15, 2010 10:28 AM

## 2010-06-23 ENCOUNTER — Telehealth (INDEPENDENT_AMBULATORY_CARE_PROVIDER_SITE_OTHER): Payer: Self-pay | Admitting: *Deleted

## 2010-06-30 NOTE — Progress Notes (Signed)
Summary: blister on left heel with drainage   Phone Note Call from Patient   Caller: DAUGHTER/ROMAINIA Call For: NADEL Summary of Call: Patients daughter Desiree Hane phoned stated that her mother has a blister on her left heel and its draining a light bloody fluid and she is concerned about this. she keeps her foot propped up and she is keeping a heel protector on it. she wants to know if there is anything else that she can do she is diabetic. Romainia can be reached at [phone] Initial call taken by: Vedia Coffer,  June 23, 2010 9:53 AM  Follow-up for Phone Call        Caller c/o pt having a clear fluid filled blister on left heel since last Monday. Today blister is draining clear fluid with a trace of bright red blood. Denies pain, fever, area being warm to touch. Please advise. Thanks.Zackery Barefoot CMA  June 23, 2010 11:04 AM   Additional Follow-up for Phone Call Additional follow up Details #1::        per SN----we can have this drained and checked by orthopedist who specializes in foot problems--Dr. Duda---or she can see a podiatrist..which ever one the pt prefers we can do an order for that.  thanks Randell Loop CMA  June 23, 2010 12:04 PM   New Problems: DIABETIC FOOT ULCER (ICD-707.14)   Additional Follow-up for Phone Call Additional follow up Details #2::    Spoke with pt's daughter and advised of the above.  She verbalized understanding and states would prefer appt with podiatry. Order was sent to [facility].  Follow-up by: Vernie Murders,  June 23, 2010 12:16 PM  New Problems: DIABETIC FOOT ULCER (ICD-707.14)

## 2010-07-05 NOTE — Progress Notes (Signed)
Summary: Schedule Office Visit to Discuss Colonoscopy  Phone Note Outgoing Call Call back at [facility] Phone [phone]   Call placed by: Harlow Mares CMA Duncan Dull),  June 06, 2010 10:26 AM Call placed to: Patient Summary of Call: pt is due for an office visit to discuss having a colonoscopy. There is no answer at her home number.  Initial call taken by: Harlow Mares CMA Duncan Dull),  June 06, 2010 10:27 AM  Follow-up for Phone Call        no answer, we will mail the patient a letter to remind her she is due for a colonoscopy,. Follow-up by: Harlow Mares CMA Duncan Dull),  June 29, 2010 11:46 AM

## 2010-07-21 ENCOUNTER — Encounter: Payer: Self-pay | Admitting: Pulmonary Disease

## 2010-07-25 ENCOUNTER — Ambulatory Visit: Payer: Self-pay | Admitting: Pulmonary Disease

## 2010-07-30 LAB — DIFFERENTIAL
Lymphocytes Relative: 27 % (ref 12–46)
Lymphs Abs: 1.8 10*3/uL (ref 0.7–4.0)
Monocytes Relative: 9 % (ref 3–12)
Neutro Abs: 4 10*3/uL (ref 1.7–7.7)
Neutrophils Relative %: 62 % (ref 43–77)

## 2010-07-30 LAB — URINALYSIS, ROUTINE W REFLEX MICROSCOPIC
Glucose, UA: NEGATIVE mg/dL
Nitrite: NEGATIVE
Protein, ur: NEGATIVE mg/dL

## 2010-07-30 LAB — CBC
MCV: 93.1 fL (ref 78.0–100.0)
Platelets: 179 10*3/uL (ref 150–400)
RBC: 4.06 MIL/uL (ref 3.87–5.11)
WBC: 6.5 10*3/uL (ref 4.0–10.5)

## 2010-07-30 LAB — COMPREHENSIVE METABOLIC PANEL
ALT: 16 U/L (ref 0–35)
Alkaline Phosphatase: 74 U/L (ref 39–117)
BUN: 19 mg/dL (ref 6–23)
CO2: 25 mEq/L (ref 19–32)
GFR calc non Af Amer: >60 mL/min (ref >60)
Glucose, Bld: 94 mg/dL (ref 70–99)
Potassium: 3.2 mEq/L — ABNORMAL LOW (ref 3.5–5.1)
Total Bilirubin: 0.4 mg/dL (ref 0.3–1.2)
Total Protein: 7.3 g/dL (ref 6.0–8.3)

## 2010-08-04 ENCOUNTER — Other Ambulatory Visit: Payer: Self-pay | Admitting: Adult Health

## 2010-08-16 ENCOUNTER — Other Ambulatory Visit: Payer: Self-pay | Admitting: Adult Health

## 2010-08-16 ENCOUNTER — Other Ambulatory Visit: Payer: Self-pay | Admitting: Pulmonary Disease

## 2010-09-06 NOTE — Consult Note (Signed)
NAMEJANESA, [name]                   ACCOUNT NO.:  [id number]   MEDICAL RECORD NO.:  [id number]          PATIENT TYPE:  INP   LOCATION:  0115                         FACILITY:  [facility]   PHYSICIAN:  Marlan Palau, M.D.  DATE OF BIRTH:  [birth year]   DATE OF CONSULTATION:  04/21/2008  DATE OF DISCHARGE:                                 CONSULTATION   HISTORY OF PRESENT ILLNESS:  Sonya [name] is an 77 year old right-handed  black female born March 22, 1925, followup through [facility]  Associates with dementia, chronic daily headaches.  Patient has a  history of hypertension, diabetes and comes in today with onset of right  facial weakness that began around 9:15 a.m.  The patient seemed to have  trouble supporting her weight.  Was standing.  Complained of increased  headache.  The patient was brought to the emergency room and a CT scan  of the brain was done showing no acute changes.  Some small-vessel  chronic changes were noted.  The patient had been moving all four  extremities.  Does have some dysarthric speech associated with the above  event.  Neurology was asked to see the patient for further evaluation.   PAST MEDICAL HISTORY:  1. Significant end-stage organic brain syndrome.  2. New onset of right Bell's palsy by clinical examination.  3. Diabetes.  4. Obesity.  5. Chronic headaches.  6. Cerebrovascular disease.  7. Hypertension.  8. Coronary artery disease status bypass procedures in the past.  9. Left cataract surgery in the past.  10.Gallbladder resection.  11.Hiatal hernia.  12.Diverticulitis.   The patient has no known allergies.  Does not smoke or drink.   CURRENT MEDICATIONS:  1. The patient is on Neurontin 3 mg one in the morning 2 in the      evening.  2. Baby aspirin a day 81 mg.  3. Aricept 10 mg daily.  4. Hydrocodone 5/500 mg every 6 hours if needed.  5. Glyburide unknown dose twice daily.  6. Labetalol unknown dose twice daily.  7. Potassium  chloride 20 mEq daily.  8. Amlodipine 10 mg daily.  9. Lasix 20 mg daily.  10.Namenda 10 mg twice daily.  11.Ambien 10 mg at bedtime if needed.   SOCIAL HISTORY:  This patient is widowed, lives in the [place] area  with her son.  Needs at least 4 hours assistance during the day.  Needs  assistance with bathing, dressing.  Can feed herself.  Is incontinent of  bowel and bladder.  The patient has at least 1 son and 1 daughter in  this area and is not employed.   FAMILY MEDICAL HISTORY:  Noted that mother died with cancer, stroke,  diabetes.  Father's cause of death is unknown.  Patient has 2 brothers,  1 brother died with diabetes and heart disease.  One brother still  living.   REVIEW OF SYSTEMS:  Difficult to obtain due to the patient's dementia.  The patient denies any significant pain at this point.  Denies shortness  of breath and nausea, vomiting.   PHYSICAL EXAMINATION:  VITALS:  Blood pressure is 155/70, heart rate 75,  respiratory rate 30, temperature afebrile.  GENERAL:  The patient is a moderately obese black female who is alert  and somewhat cooperative during examination.  HEENT:  Examination head is atraumatic.  Eyes:  Pupils are postsurgical  on the left.  Both pupils round, react to light.  Cataracts present on  the right.  Disks are flat bilaterally.  NECK:  Supple.  No carotid bruits noted.  RESPIRATORY:  Examination is clear.  CARDIOVASCULAR:  Reveals distant heart sounds.  No obvious murmurs, rubs  noted.  EXTREMITIES:  Without significant edema.  NEUROLOGIC:  Cranial nerves as above.  The patient has very definite  peripheral distribution of weakness of the right face, decreased eye  blink on the right, normal on the left.  The patient has slight  divergence of gaze.  Will blink to threat bilaterally.  Speech is fairly  well enunciated but the patient had difficulty comprehending directions  given to her.  Can clearly move all fours fairly well.  Possibly  does  not move the right leg as well as the left, but has antigravity movement  of all fours.  Patient has significant apraxia and is difficulty  performing finger-nose-finger and toe to finger, but has no obvious  ataxia.  Deep tendon reflexes depressed but symmetric.  Toes are  neutral.  The patient responds to pain stimulation symmetrically on all  fours including the face, but an accurate sensory testing evaluation is  difficult due to the dementia.  Speech is dysarthric.   LABORATORY VALUES:  Notable for white count 7.1, hemoglobin 13.0,  hematocrit 38.4, MCV of 93.1, platelets of 174,000, sodium 145,  potassium 3.4, chloride of 112, CO2 20, glucose of 190, BUN of 23,  creatinine 0.88, calcium of 9.7, total protein 7.1, albumin of 3.6, AST  of 23, ALT of 13.  Urinalysis reveals specific gravity of 1.029, pH of  6.0 and 3 to 6 white cells, 0 to 2 red cells.   CT is as above.   IMPRESSION:  1. New onset right facial droop, dysarthria.  Clinical examination      consistent with the right Bell's palsy.  2. Diabetes.  3. Hypertension.  4. Severe dementia.   This patient appears to have a clinical examination most consistent with  a Bell's palsy on the right.  Particularly in light of her underlying  dementia, I do not think any further neurologic workup is indicated.  The patient may be discharged to home.  Will need to protect the eye.  She cannot close at all the way.  The weakness may worsen of the next  several days.  Would not administer acyclovir or prednisone.      Marlan Palau, M.D.  Electronically Signed     CKW/MEDQ  D:  04/21/2008  T:  04/22/2008  Job:  [id number]   cc:   Lonzo Cloud. Kriste Basque, MD  [address]  [place]  [place] [address]   [facility]  [address]  [address]

## 2010-09-06 NOTE — H&P (Signed)
Sonya [name], Sonya [name]                   ACCOUNT NO.:  [id number]   MEDICAL RECORD NO.:  [id number]          PATIENT TYPE:  EMS   LOCATION:  ED                           FACILITY:  [facility]   PHYSICIAN:  Lonzo Cloud. Kriste Basque, MD     DATE OF BIRTH:  Feb 14, 1925   DATE OF ADMISSION:  04/21/2008  DATE OF DISCHARGE:                              HISTORY & PHYSICAL   CHIEF COMPLAINT:  Slurred speech and facial droop.   This is a pleasant but demented 77 year old female who presents today  accompanied by her sons, Adela Glimpse and Windy Fast, to the emergency room on  April 21, 2008.  The patient is unable to provide history but  according to the son, Adela Glimpse, with whom she lives with the patient  began to complain of increasing headache much greater than baseline on  April 20, 2008.  He also noted her to be a bit more drowsy than  usual, however, had no significant other changes above her baseline.  Of  note, she has seen Dr. Anne Hahn in the past for migraine headaches.  Apparently, following awakening this morning and during usual activities  assisted by nursing technician at home, the patient was noted to have  right facial droop and slurred speech.  Because of these findings, the  nursing technician notified the family who then brought Sonya [name] to the  emergency room for further evaluation.  Upon initial evaluation, Ms.  [name] was found to be awake, confused but cooperative.  She has a notable  right facial droop and significant slurred speech.  Her strength is  essentially equal with perhaps some mild weakness on the right greater  than the left.  She will be admitted for further evaluation.   PAST MEDICAL HISTORY:  1. Hypertension.  2. Coronary artery disease with last ejection fraction 70% last      evaluated on April 21, 2008.  She does have a history of 2-      vessel nonobstructive disease.  3. Hypercholesterolemia.  4. Diabetes, type 2.  5. Hiatal hernia.  6. Peptic ulcer disease.  7. History  of diverticulosis of the colon.  8. Irritable bowel syndrome.  9. History of renal insufficiency with stable serum creatinine at this      point.  10.Degenerative a disease.  11.Chronic back pain.  12.History of TIA for which she was on aspirin.  13.History of headaches for which she treated with Neurontin by Dr.      Anne Hahn, also history of senile dementia for which she is on both      Aricept and Namenda.  14.History of shingles and anemia.   SOCIAL HISTORY:  The patient is debilitated, she has lived with her son  for the last 4 years.  She requires assistance with most activities of  daily living.  She is unable to perform routine house chores, she  requires assistance getting dressed but is independent ambulating  throughout the house, she is able to feed herself, typically enjoys an  acceptable quality of life per family report.   FAMILY HISTORY:  Not  applicable.   ALLERGIES:  LISTED IN RECORDS AS:  1. LIPITOR.  2. ACTOS.   CURRENT MEDICATION LIST:  Includes:  1. Labetalol 20 mg p.o. 1 tab twice a day.  2. Glyburide/metformin 2.5/500 1 tab twice a day.  3. Aricept 10 mg 1 tab daily.  4. Namenda 10 mg 1 tab twice a day.  5. Gabapentin 300 mg before bed.  6. Ambien 10 mg before bed p.r.n.  7. Tramadol 50 mg 4 times a day p.r.n.  8. Norvasc 10 mg half tab daily.   REVIEW OF SYSTEMS:  Currently Sonya [name] complaints of a headache  primarily focal over the frontal forehead, her speech is slurred, she is  oriented x1, however, she does follow commands.  No other pertinent  positives other than those mentioned above in the history of present  illness.   PHYSICAL EXAM:  Temperature 98.2.  Heart rate 63 to 71.  Blood pressure  123/57 to 151/62.  Respirations 20.  Saturations 100% on room air.  GENERAL:  This is a morbidly obese, debilitated, African American female  currently in no acute distress.  HEENT:  The pupils are equal and reactive.  She does have a notable  right  facial droop, as well as slurred speech.  Her mucous membranes are  dry.  She has no jugular venous distention.  PULMONARY:  Clear to auscultation, however, diminished in the bases.  CARDIAC:  Regular irregular rhythm.  Currently appears to be normal  sinus rhythm to sinus brady with occasional PVCs, however, there is  significant artifact on bedside telemetry.  ABDOMEN:  Obese, positive bowel sounds.  No organomegaly.  GU:  Concentrated urine.  NEUROLOGICAL:  Awake, follows commands readily, appears weaker on the  right than the left.  Her speech is markedly slurred with notable right  facial droop.  Her pupils are equal and reactive to light.   LABORATORY AND DIAGNOSTIC DATA:  To date urinalysis demonstrates  negative nitrites.  Sodium 145, potassium 3.4, chloride 112, CO2 of 20,  BUN 23, creatinine 0.88, glucose 190, hemoglobin 13, hematocrit 38.4,  white blood cell count 7.1, platelets 174.  CT of head demonstrates  atrophy but no evident acute process.   IMPRESSION AND PLAN:  1. Is that of probable acute to subacute left-sided cerebrovascular      accident.  It is unclear when onset of symptoms occurred, these      symptoms were first identified during routine activities this      morning while home health aide was assisting her in getting      dressed, however, not sure when onset of initial symptoms occurred.      At any rate, she will be admitted for further evaluation.  Plan at      this point will be to continue aspirin administration, neurology      consult has been requested.  She has seen Dr. Anne Hahn in the past.      We will follow up an MRI, carotid Dopplers, and 12-lead EKG.  She      will have a speech language pathology evaluation for dysphagia and      full rehabilitation service evaluation.  Additionally, we will keep      her normotensive at this point.  2. Hypertension.  Plan for this is to continue home regimen.  There is      no significant hypertension at this  point.  She appears controlled      on current regimen.  3. Renal insufficiency.  Plan for this is to continue home medications      and monitor serum creatinine closely.  4. Diabetes, type 2.  Plan for this is to administer sliding scale      insulin.   DISPOSITION:  Sonya [name] will be admitted to the regular medical ward with  diagnostic evaluation pending as mentioned above.  Did discuss advanced  directives with the patient's sons, Adela Glimpse and Windy Fast, who are at the  bedside.  They have agreed and requested to full DNR status, they do  request full medical diagnostics and care, however, should her medical  status abruptly deteriorate they do not want intubation, ventilation,  CPR, cardioversion, defibrillation, or any other advanced cardiac life  support measures.  We will respect this request.      Zenia Resides, NP      Lonzo Cloud. Kriste Basque, MD  Electronically Signed    PB/MEDQ  D:  04/21/2008  T:  04/21/2008  Job:  [id number]

## 2010-09-28 ENCOUNTER — Other Ambulatory Visit: Payer: Self-pay | Admitting: Adult Health

## 2010-10-03 NOTE — Telephone Encounter (Signed)
Pt last seen 01/2010, told to follow up in 6months.  NS for appt 07/2010.

## 2010-11-14 ENCOUNTER — Other Ambulatory Visit: Payer: Self-pay | Admitting: *Deleted

## 2010-11-14 MED ORDER — LABETALOL HCL 200 MG PO TABS: 200.0000 mg | ORAL_TABLET | Freq: Two times a day (BID) | ORAL | Status: DC

## 2011-01-08 ENCOUNTER — Other Ambulatory Visit: Payer: Self-pay | Admitting: Pulmonary Disease

## 2011-01-11 ENCOUNTER — Other Ambulatory Visit: Payer: Self-pay | Admitting: *Deleted

## 2011-01-11 MED ORDER — MEMANTINE HCL 10 MG PO TABS: 10.0000 mg | ORAL_TABLET | Freq: Two times a day (BID) | ORAL | Status: DC

## 2011-01-27 LAB — COMPREHENSIVE METABOLIC PANEL
ALT: 12 U/L (ref 0–35)
AST: 22 U/L (ref 0–37)
AST: 23 U/L (ref 0–37)
CO2: 20 mEq/L (ref 19–32)
CO2: 25 mEq/L (ref 19–32)
Calcium: 9.3 mg/dL (ref 8.4–10.5)
Calcium: 9.7 mg/dL (ref 8.4–10.5)
Chloride: 110 mEq/L (ref 96–112)
Chloride: 112 mEq/L (ref 96–112)
Creatinine, Ser: 0.72 mg/dL (ref 0.4–1.2)
Creatinine, Ser: 0.88 mg/dL (ref 0.4–1.2)
GFR calc Af Amer: >60 mL/min (ref >60)
GFR calc non Af Amer: >60 mL/min (ref >60)
GFR calc non Af Amer: >60 mL/min (ref >60)
Glucose, Bld: 148 mg/dL — ABNORMAL HIGH (ref 70–99)
Glucose, Bld: 190 mg/dL — ABNORMAL HIGH (ref 70–99)
Sodium: 144 mEq/L (ref 135–145)
Total Bilirubin: 0.8 mg/dL (ref 0.3–1.2)
Total Bilirubin: 0.9 mg/dL (ref 0.3–1.2)

## 2011-01-27 LAB — URINE CULTURE
Colony Count: NO GROWTH
Culture: NO GROWTH

## 2011-01-27 LAB — DIFFERENTIAL
Basophils Absolute: 0 10*3/uL (ref 0.0–0.1)
Eosinophils Absolute: 0.1 10*3/uL (ref 0.0–0.7)
Eosinophils Relative: 1 % (ref 0–5)
Lymphocytes Relative: 32 % (ref 12–46)
Lymphs Abs: 2.3 10*3/uL (ref 0.7–4.0)
Neutrophils Relative %: 61 % (ref 43–77)

## 2011-01-27 LAB — CK TOTAL AND CKMB (NOT AT ARMC)
Relative Index: INVALID (ref 0.0–2.5)
Total CK: 53 U/L (ref 7–177)

## 2011-01-27 LAB — CBC
HCT: 38.4 % (ref 36.0–46.0)
Hemoglobin: 13 g/dL (ref 12.0–15.0)
Hemoglobin: 13.1 g/dL (ref 12.0–15.0)
MCHC: 33.4 g/dL (ref 30.0–36.0)
MCHC: 33.8 g/dL (ref 30.0–36.0)
MCV: 92.9 fL (ref 78.0–100.0)
MCV: 93.1 fL (ref 78.0–100.0)
RBC: 4.12 MIL/uL (ref 3.87–5.11)
RBC: 4.21 MIL/uL (ref 3.87–5.11)
WBC: 5.3 10*3/uL (ref 4.0–10.5)
WBC: 7.1 10*3/uL (ref 4.0–10.5)

## 2011-01-27 LAB — CARDIAC PANEL(CRET KIN+CKTOT+MB+TROPI)
Relative Index: INVALID (ref 0.0–2.5)
Total CK: 69 U/L (ref 7–177)
Troponin I: <0.01 ng/mL (ref 0.00–0.06)

## 2011-01-27 LAB — URINALYSIS, ROUTINE W REFLEX MICROSCOPIC
Ketones, ur: NEGATIVE mg/dL
Nitrite: NEGATIVE
Protein, ur: NEGATIVE mg/dL
pH: 6 (ref 5.0–8.0)

## 2011-01-27 LAB — URINE MICROSCOPIC-ADD ON

## 2011-01-27 LAB — HEMOGLOBIN A1C
Hgb A1c MFr Bld: 6.2 % — ABNORMAL HIGH (ref 4.6–6.1)
Mean Plasma Glucose: 131 mg/dL

## 2011-02-15 ENCOUNTER — Ambulatory Visit (INDEPENDENT_AMBULATORY_CARE_PROVIDER_SITE_OTHER): Payer: Medicaid Other

## 2011-02-15 DIAGNOSIS — Z23 Encounter for immunization: Secondary | ICD-10-CM

## 2011-03-12 ENCOUNTER — Other Ambulatory Visit: Payer: Self-pay | Admitting: Adult Health

## 2011-04-06 ENCOUNTER — Encounter (HOSPITAL_COMMUNITY): Payer: Self-pay | Admitting: *Deleted

## 2011-04-06 ENCOUNTER — Telehealth: Payer: Self-pay | Admitting: Pulmonary Disease

## 2011-04-06 ENCOUNTER — Inpatient Hospital Stay (HOSPITAL_COMMUNITY): Payer: PRIVATE HEALTH INSURANCE

## 2011-04-06 ENCOUNTER — Inpatient Hospital Stay (HOSPITAL_COMMUNITY)
Admission: AD | Admit: 2011-04-06 | Discharge: 2011-04-06 | Disposition: A | Discharge status: Home / Self Care | Payer: PRIVATE HEALTH INSURANCE | Source: Ambulatory Visit | Attending: Obstetrics & Gynecology | Admitting: Obstetrics & Gynecology

## 2011-04-06 DIAGNOSIS — F0390 Unspecified dementia without behavioral disturbance: Secondary | ICD-10-CM | POA: Insufficient documentation

## 2011-04-06 DIAGNOSIS — N938 Other specified abnormal uterine and vaginal bleeding: Secondary | ICD-10-CM | POA: Insufficient documentation

## 2011-04-06 DIAGNOSIS — N949 Unspecified condition associated with female genital organs and menstrual cycle: Secondary | ICD-10-CM | POA: Insufficient documentation

## 2011-04-06 LAB — CBC
Hemoglobin: 12.4 g/dL (ref 12.0–15.0)
MCH: 30.4 pg (ref 26.0–34.0)
MCHC: 32.8 g/dL (ref 30.0–36.0)
Platelets: 134 10*3/uL — ABNORMAL LOW (ref 150–400)
RDW: 13.2 % (ref 11.5–15.5)

## 2011-04-06 LAB — URINALYSIS, ROUTINE W REFLEX MICROSCOPIC
Bilirubin Urine: NEGATIVE
Ketones, ur: 15 mg/dL — AB
Leukocytes, UA: NEGATIVE
Nitrite: NEGATIVE
Urobilinogen, UA: 1 mg/dL (ref 0.0–1.0)
pH: 6 (ref 5.0–8.0)

## 2011-04-06 NOTE — ED Notes (Signed)
Pt in wc, is quiet, calm, cooperative.

## 2011-04-06 NOTE — Progress Notes (Signed)
U/s at bedside

## 2011-04-06 NOTE — Telephone Encounter (Signed)
Called and spoke with pt's family member, [name].  [name] states she went to change pt's Depends and noticed 2 blood clots larger than a 50 cent piece and BRB coming from her vagnia.  [name] states pt doesn't have a GYN that she knows of and is not on any blood thinners.  Recommend [name] take pt to [facility] ER asap to be evaluated.  [name] verbalized understanding and agreed to take pt there now.  Will forward message to SN as an FYI.

## 2011-04-06 NOTE — ED Provider Notes (Signed)
History     Chief Complaint  Patient presents with  . Vaginal Bleeding   HPI Comments: [name] is a 77yo G10P10, non verbal secondary to alzheimer's dementia, who presents with her daughter (Sonya Price) with suspected vaginal bleeding.  Sonya Price states that she was giving her mom a bath this morning, and when she went to change her Depends she noted 2 quarter sized clots in the diaper.  She washed her bottom well to try and determine the source of the bleeding, and believes it is from the vagina.  She called Sonya Price PCP and was advised to come to the MAU.    No history of previous vaginal or GI bleeding.  Denies use of blood thinners.  Last BM yesterday (nl color and consistency).  No known history of birth control use, smoking, gynecological cancer or surgeries.  All children were delivered vaginally.   Pt has been in her normal state of health.  At baseline, she will occasionally participate in conversations, though usually just "yes" or "no."  She is wheelchair bound and dependent on others for all her ADLs.  She lives at home.  Daughter Sonya Price is her home health aid.    Vaginal Bleeding Pertinent negatives include no abdominal pain, constipation, diarrhea, fever, rash or vomiting.     Past Medical History  Diagnosis Date  . Unspecified essential hypertension   . CAD (coronary artery disease)   . Type II or unspecified type diabetes mellitus without mention of complication, not stated as uncontrolled   . Diaphragmatic hernia without mention of obstruction or gangrene   . Diverticulosis of colon (without mention of hemorrhage)   . Irritable bowel syndrome   . Unspecified disorder resulting from impaired renal function   . Osteoarthrosis, unspecified whether generalized or localized, unspecified site   . Unspecified transient cerebral ischemia   . Lumbago   . Headache   . Senile dementia, uncomplicated   . Bell's palsy   . Anemia, unspecified     Past Surgical History    Procedure Date  . Cholecystectomy   . Hiatal hernia repair     Family History  Problem Relation Age of Onset  . Hypertension    . Diabetes    . Heart disease      History  Substance Use Topics  . Smoking status: Former Games developer  . Smokeless tobacco: Not on file  . Alcohol Use: No    Allergies:  Allergies  Allergen Reactions  . Atorvastatin     REACTION: increased myalgia/CPK  . Pioglitazone     REACTION: swelling    Prescriptions prior to admission  Medication Sig Dispense Refill  . amLODipine (NORVASC) 10 MG tablet TAKE 1/2 TABLET BY MOUTH ONCE DAILY  30 tablet  4  . donepezil (ARICEPT) 10 MG tablet TAKE 1 TABLET BY MOUTH ONCE A DAY  30 tablet  7  . glyBURIDE-metformin (GLUCOVANCE) 2.5-500 MG per tablet daily with breakfast. 1/2 daily with breakfast       . labetalol (NORMODYNE) 200 MG tablet TAKE 1 TABLET BY MOUTH TWICE A DAY  60 tablet  5  . memantine (NAMENDA) 10 MG tablet Take 1 tablet (10 mg total) by mouth 2 (two) times daily.  60 tablet  0  . traMADol (ULTRAM) 50 MG tablet TAKE 1 TABLET 4 TIMES A DAY AS NEEDED FOR ARTHRITIS PAIN  100 tablet  2    Review of Systems  Constitutional: Negative for fever and weight loss.  ROS was obtained from daughter, as pt was unable to participate in exam.   HENT: Negative for congestion.   Respiratory: Negative for cough and shortness of breath.   Cardiovascular: Negative for leg swelling.  Gastrointestinal: Negative for vomiting, abdominal pain, diarrhea, constipation and blood in stool.  Genitourinary: Positive for vaginal bleeding.  Musculoskeletal: Negative for myalgias and falls.  Skin: Negative for rash.   Physical Exam   Blood pressure 142/61, pulse 70, temperature 98.2 F (36.8 C), temperature source Oral, resp. rate 18, SpO2 93.00%.  Physical Exam  Constitutional: She appears well-developed and well-nourished. No distress.       Pt found lying in bed with her eyes closed.  She responds to her daughter  occasionally with "mmmhmm."  Opens eyes when asked.   Neck: Neck supple. No tracheal deviation present. No thyromegaly present.  Cardiovascular: Normal rate and regular rhythm.  Exam reveals no gallop and no friction rub.   No murmur heard. Respiratory: Effort normal and breath sounds normal. No respiratory distress.  GI: Soft. She exhibits no mass. There is no tenderness. There is no guarding.  Genitourinary: There is no rash, tenderness, lesion or injury on the right labia. There is no rash, tenderness, lesion or injury on the left labia.       Blood clot in the vaginal vault.  No abrasions or tears noted.  Atrophic.    Neurological:       Responds to commands occasionally. Able to squeeze my hand lightly.  Otherwise unable to participate in neuro exam.   Skin: Skin is dry. No rash noted. She is not diaphoretic. No erythema.    MAU Course  Procedures  MDM   Assessment and Plan  1)Vaginal bleeding - speculum exam, transvaginal US, total abdo [name]. UA. 2)advanced dementia 3)DM 4)HTN 5)CAD 6)hx of anemia 7) Diverticulosis    O'Grady, Gisell Buehrle 04/06/2011, 3:07 PM

## 2011-04-06 NOTE — Progress Notes (Signed)
Pt eyes closed, non-verbal. Palpated abdomen no grimacing or change in facial expression.

## 2011-04-06 NOTE — ED Notes (Signed)
Pt has dementia for 6-5yrs.  Minimal talking, does not answer questions.  Will respond to pain per daughter.  Doesn't really walk, takes "little baby steps."

## 2011-04-06 NOTE — Progress Notes (Signed)
Pt has demensia.  Daughter is here, is A.M. Caregiver.  Blood noted in depends this morning when bathing pt, noted coming from vagina.  Unable to determine if having pain per daughter.

## 2011-04-06 NOTE — Progress Notes (Signed)
Pt non-verbal hx dementia per family. Daughter states that this am she was giving her a bath and noticed lemon size blood clots. Daughter states it is coming from her vagina not rectum.

## 2011-04-26 ENCOUNTER — Encounter: Payer: Self-pay | Admitting: Obstetrics & Gynecology

## 2011-04-26 ENCOUNTER — Ambulatory Visit (INDEPENDENT_AMBULATORY_CARE_PROVIDER_SITE_OTHER): Payer: 59 | Admitting: Obstetrics & Gynecology

## 2011-04-26 DIAGNOSIS — N950 Postmenopausal bleeding: Secondary | ICD-10-CM | POA: Insufficient documentation

## 2011-04-26 NOTE — Patient Instructions (Signed)
Postmenopausal Bleeding Menopause is the gradual end of ovulation and menstrual periods. Postmenopausal bleeding is bleeding from the uterus 12 months or more after menstrual periods have stopped. Postmenopausal bleeding is different from the few, irregular periods that happen around the time of menopause. Postmenopausal bleeding is common, but not normal. Tell your caregiver if you have any bleeding after menopause.  CAUSES   Hormone therapy (HT). This is the most common cause of postmenopausal bleeding.   Cervical, ovarian, or endometrial cancer.   Thinning of the uterus endometrium (uterine atrophy).   Thyroid and other medical diseases.   Medications other than hormones.   Endometrium infection (endometritis).   Estrogen-secreting tumors in other parts of the body.   Cervical lesions (polps/infection).   Endometrial polyps.   Uterine tumors (fibroid/polyps).   Being very overweight (obese).  SYMPTOMS  Postmenopausal bleeding can start in different parts of the reproductive system.   Bleeding from the vagina may happen. This happens because when estrogen secretion stops, the vagina dries out and can weaken (atrophy). This is the most common cause of bleeding from the lower reproductive tract.   Lesions and cracks on the vulva may also bleed.   Sometimes bleeding happens after sexual intercourse.   Bleeding from the cervix if there are polyps, cancer, or an infection.   Bleeding can happen with or without a related infection.  DIAGNOSIS   The caregiver will ask for a detailed history of how long bleeding has been going on. A woman should keep a record of the time, the length, how often and amount of bleeding. She should tell the caregiver about any medications she is taking, especially any hormones or steroids.   The caregiver will do a pelvic exam and PAP test. The vulva and vagina are looked at for signs of atrophy. The caregiver will feel for any sign of uterine  fibroids. Depending on the exam results, more testing may be needed.  TREATMENT  Treatment depends on the cause of the bleeding.  It is common for women just beginning HT to have some bleeding. Most women who are on HT also take progesterone with estrogen if they still have their uterus. They may have monthly withdrawal bleeding. This is a normal side effect that often does not need treatment. Taking a low dose of estrogen and progesterone daily can prevent having a menstrual period.   Postmenopausal bleeding due to vulva or vaginal bleeding can be treated with estrogen creams, patches, or pills.   If cancer is found, some form of surgery is needed. The uterus, cervix, ovaries, and fallopian tubes may all be removed depending on the type and location of the cancer. If the problem is estrogen or androgen-producing tumors elsewhere in the body, these must also be surgically removed.   Postmenopausal bleeding that is not due to cancer and cannot be controlled by any other treatment usually requires a hysterectomy. This operation is not without risk and possible complications.   If the cause is endometrium thinning, hormones are given.   If there is endometrial hyperplasia, progesterone may be given or D and C may be done.   If there are polyps in the uterus, a hysteroscopy or D and C is done to remove them.   If fibroids are present, they can be removed or a hysterectomy may be needed.   If the problem is medical, thyroid or other medical treatment is done.   If medication is causing the problem, changing or stopping the medication may be   needed.  HOME CARE INSTRUCTIONS   Postmenopausal bleeding is not a preventable disorder. However, maintaining a healthy weight will decrease the chances of it happening.   See your caregiver if you are missing menstrual periods all the time.   A Pap test is done to screen for cervical cancer.   The first Pap test should be done at age 21.   Between  ages 21 and 29, Pap tests are repeated every 2 years.   Beginning at age 30, you are advised to have a Pap test every 3 years as long as your past 3 Pap tests have been normal.   Some women have medical problems that increase the chance of getting cervical cancer. Talk to your caregiver about these problems. It is especially important to talk to your caregiver if a new problem develops soon after your last Pap test. In these cases, your caregiver may recommend more frequent screening and Pap tests.   The above recommendations are the same for women who have or have not gotten the vaccine for HPV (Human Papillomavirus).   If you had a hysterectomy for a problem that was not a cancer or a condition that could lead to cancer, then you no longer need Pap tests. However, even if you no longer need a Pap test, a regular exam is a good idea to make sure no other problems are starting.    If you are between ages 65 and 70, and you have had normal Pap tests going back 10 years, you no longer need Pap tests. However, even if you no longer need a Pap test, a regular exam is a good idea to make sure no other problems are starting.    If you have had past treatment for cervical cancer or a condition that could lead to cancer, you need Pap tests and screening for cancer for at least 20 years after your treatment.   If Pap tests have been discontinued, risk factors (such as a new sexual partner) need to be re-assessed to determine if screening should be resumed.   Some women may need screenings more often if they are at high risk for cervical cancer.   See your caregiver if you have any kind of bleeding after menopause.  SEEK MEDICAL CARE IF:   You have any kind of vaginal bleeding after menopause.   You develop bleeding with sexual intercourse.   You are still having menstrual periods after age 55.   You are gaining too much weight.  Document Released: 07/19/2005 Document Revised: 10/24/2010  Document Reviewed: 08/13/2007 [employer] Patient Information 2012 [employer]. 

## 2011-04-26 NOTE — Progress Notes (Incomplete Revision)
Subjective:    Patient ID: Sonya Price, female    DOB: 13-Sep-1924, 78 y.o.   MRN: [id number]  [id number] MAU visit 04/06/11 History    Chief Complaint   Patient presents with   .  Vaginal Bleeding   HPI Comments: Sonya Price is a 78yo G10P10, non verbal secondary to alzheimer's dementia, who presents with her daughter ([name]) with suspected vaginal bleeding. [name] states that she was giving her mom a bath this morning, and when she went to change her Depends she noted 2 quarter sized clots in the diaper. She washed her bottom well to try and determine the source of the bleeding, and believes it is from the vagina. She called Ms. Galanti PCP and was advised to come to the MAU.  No history of previous vaginal or GI bleeding. Denies use of blood thinners.  Last BM yesterday (nl color and consistency).  No known history of birth control use, smoking, gynecological cancer or surgeries. All children were delivered vaginally.  Pt has been in her normal state of health. At baseline, she will occasionally participate in conversations, though usually just "yes" or "no." She is wheelchair bound and dependent on others for all her ADLs. She lives at home. Daughter [name] is her home health aid.  Vaginal Bleeding  Pertinent negatives include no abdominal pain, constipation, diarrhea, fever, rash or vomiting.  Past Medical History   Diagnosis  Date   .  Unspecified essential hypertension    .  CAD (coronary artery disease)    .  Type II or unspecified type diabetes mellitus without mention of complication, not stated as uncontrolled    .  Diaphragmatic hernia without mention of obstruction or gangrene    .  Diverticulosis of colon (without mention of hemorrhage)    .  Irritable bowel syndrome    .  Unspecified disorder resulting from impaired renal function    .  Osteoarthrosis, unspecified whether generalized or localized, unspecified site    .  Unspecified transient cerebral ischemia    .   Lumbago    .  Headache    .  Senile dementia, uncomplicated    .  Bell's palsy    .  Anemia, unspecified     Past Surgical History   Procedure  Date   .  Cholecystectomy    .  Hiatal hernia repair     Family History   Problem  Relation  Age of Onset   .  Hypertension     .  Diabetes     .  Heart disease      History   Substance Use Topics   .  Smoking status:  Former Games developer   .  Smokeless tobacco:  Not on file   .  Alcohol Use:  No   Allergies:  Allergies   Allergen  Reactions   .  Atorvastatin      REACTION: increased myalgia/CPK   .  Pioglitazone      REACTION: swelling    Prescriptions prior to admission   Medication  Sig  Dispense  Refill   .  amLODipine (NORVASC) 10 MG tablet  TAKE 1/2 TABLET BY MOUTH ONCE DAILY  30 tablet  4   .  donepezil (ARICEPT) 10 MG tablet  TAKE 1 TABLET BY MOUTH ONCE A DAY  30 tablet  7   .  glyBURIDE-metformin (GLUCOVANCE) 2.5-500 MG per tablet  daily with breakfast. 1/2 daily with breakfast     .  labetalol (NORMODYNE) 200 MG tablet  TAKE 1 TABLET BY MOUTH TWICE A DAY  60 tablet  5   .  memantine (NAMENDA) 10 MG tablet  Take 1 tablet (10 mg total) by mouth 2 (two) times daily.  60 tablet  0   .  traMADol (ULTRAM) 50 MG tablet  TAKE 1 TABLET 4 TIMES A DAY AS NEEDED FOR ARTHRITIS PAIN  100 tablet  2   Review of Systems  Constitutional: Negative for fever and weight loss.  ROS was obtained from daughter, as pt was unable to participate in exam.  HENT: Negative for congestion.  Respiratory: Negative for cough and shortness of breath.  Cardiovascular: Negative for leg swelling.  Gastrointestinal: Negative for vomiting, abdominal pain, diarrhea, constipation and blood in stool.  Genitourinary: Positive for vaginal bleeding.  Musculoskeletal: Negative for myalgias and falls.  Skin: Negative for rash.  Physical Exam   Blood pressure 142/61, pulse 70, temperature 98.2 F (36.8 C), temperature source Oral, resp. rate 18, SpO2 93.00%.  Physical  Exam  Constitutional: She appears well-developed and well-nourished. No distress.  Pt found lying in bed with her eyes closed. She responds to her daughter occasionally with "mmmhmm." Opens eyes when asked.  Neck: Neck supple. No tracheal deviation present. No thyromegaly present.  Cardiovascular: Normal rate and regular rhythm. Exam reveals no gallop and no friction rub.  No murmur heard.  Respiratory: Effort normal and breath sounds normal. No respiratory distress.  GI: Soft. She exhibits no mass. There is no tenderness. There is no guarding.  Genitourinary: There is no rash, tenderness, lesion or injury on the right labia. There is no rash, tenderness, lesion or injury on the left labia.  Blood clot in the vaginal vault. No abrasions or tears noted. Atrophic.  Neurological:  Responds to commands occasionally. Able to squeeze my hand lightly. Otherwise unable to participate in neuro exam.  Skin: Skin is dry. No rash noted. She is not diaphoretic. No erythema.  MAU Course   Procedures  MDM  Assessment and Plan   1)Vaginal bleeding - speculum exam, transvaginal US, total abdo [name]. UA.  2)advanced dementia  3)DM  4)HTN  5)CAD  6)hx of anemia  7) Diverticulosis  *RADIOLOGY REPORT*  Clinical Data: Postmenopausal bleeding  TRANSABDOMINAL AND TRANSVAGINAL ULTRASOUND OF PELVIS  Technique: Both transabdominal and transvaginal ultrasound  examinations of the pelvis were performed. Transabdominal technique  was performed for global imaging of the pelvis including uterus,  ovaries, adnexal regions, and pelvic cul-de-sac.  Comparison: None.  It was necessary to proceed with endovaginal exam following the  transabdominal exam to visualize the myometrium, endometrium and  ovaries.  Findings:  Uterus: measures 6.5 cm in length, 4.8 cm in depth and 3.5 cm in  width. A homogeneous myometrium is seen. The cervix appears normal.  Endometrium: is distended by simple fluid to a width of 2.3 cm and    contains a lobulated hypoechoic, avascular soft tissue density  overlying the internal os. The appearance is suspicious for a clot.  The endometrial lining appears thin with no focal thickening  suggested  Right ovary: Not seen either transabdominally or endovaginally.  Left ovary: Not seen either transabdominally or endovaginally.  Other findings: No free fluid or separate adnexal masses are seen.  IMPRESSION:  Distended endometrial canal with both simple fluid and soft tissue  suspicious for clot. No definite focal endometrial abnormality is  seen and this may be the result of some retention due to relative  cervical stenosis  given the normal appearance of the cervix.  Nonvisualized ovaries.  Original Report Authenticated By: Bertha Stakes, M.D.       Pt comes due to recent episode of postmenopausal bleeding, which has not recurred. Her son and daughter are with her today. We discussed that because of her dementia and her fractional medical condition at most of her care is pain and at comfort and quality of life. Her ultrasound did not show any endometrial lesions only some fluid consistent with blood. The endometrium appeared to be thin. As she is not a candidate to pursue any surgical interventions and do not recommend biopsies or further evaluation this episode of bleeding. Her son and daughter were in agreement with this. The patient is present but is not part of the conversation due to her dementia. It was a. That would be fine for her to be seen due to continued problems with bleeding. I deferred any physical exam today.She can continue to followup with her primary care provider. Dr. Scheryl Darter 04/26/2011     Review of Systems     Objective:   Physical Exam        Assessment & Plan:

## 2011-04-26 NOTE — Progress Notes (Signed)
  Subjective:    Patient ID: Sonya Price, female    DOB: [birth year], 78 y.o.   MRN: [id number]  HPIG10P0     Review of Systems     Objective:   Physical Exam        Assessment & Plan:

## 2011-05-02 ENCOUNTER — Other Ambulatory Visit: Payer: Self-pay | Admitting: Pulmonary Disease

## 2011-05-02 MED ORDER — MEMANTINE HCL 10 MG PO TABS: 10.0000 mg | ORAL_TABLET | Freq: Two times a day (BID) | ORAL | Status: DC

## 2011-05-08 ENCOUNTER — Ambulatory Visit (INDEPENDENT_AMBULATORY_CARE_PROVIDER_SITE_OTHER): Payer: 59 | Admitting: Pulmonary Disease

## 2011-05-08 ENCOUNTER — Encounter: Payer: Self-pay | Admitting: Pulmonary Disease

## 2011-05-08 ENCOUNTER — Other Ambulatory Visit (INDEPENDENT_AMBULATORY_CARE_PROVIDER_SITE_OTHER): Payer: Medicare Other

## 2011-05-08 DIAGNOSIS — M1990 Unspecified osteoarthritis, unspecified site: Secondary | ICD-10-CM

## 2011-05-08 DIAGNOSIS — N950 Postmenopausal bleeding: Secondary | ICD-10-CM

## 2011-05-08 DIAGNOSIS — F419 Anxiety disorder, unspecified: Secondary | ICD-10-CM

## 2011-05-08 DIAGNOSIS — K589 Irritable bowel syndrome without diarrhea: Secondary | ICD-10-CM

## 2011-05-08 DIAGNOSIS — I10 Essential (primary) hypertension: Secondary | ICD-10-CM

## 2011-05-08 DIAGNOSIS — K449 Diaphragmatic hernia without obstruction or gangrene: Secondary | ICD-10-CM

## 2011-05-08 DIAGNOSIS — F411 Generalized anxiety disorder: Secondary | ICD-10-CM

## 2011-05-08 DIAGNOSIS — M545 Low back pain, unspecified: Secondary | ICD-10-CM

## 2011-05-08 DIAGNOSIS — D649 Anemia, unspecified: Secondary | ICD-10-CM

## 2011-05-08 DIAGNOSIS — K5730 Diverticulosis of large intestine without perforation or abscess without bleeding: Secondary | ICD-10-CM

## 2011-05-08 DIAGNOSIS — E7800 Pure hypercholesterolemia, unspecified: Secondary | ICD-10-CM

## 2011-05-08 DIAGNOSIS — E119 Type 2 diabetes mellitus without complications: Secondary | ICD-10-CM

## 2011-05-08 DIAGNOSIS — I2510 Atherosclerotic heart disease of native coronary artery without angina pectoris: Secondary | ICD-10-CM

## 2011-05-08 DIAGNOSIS — N259 Disorder resulting from impaired renal tubular function, unspecified: Secondary | ICD-10-CM

## 2011-05-08 DIAGNOSIS — G459 Transient cerebral ischemic attack, unspecified: Secondary | ICD-10-CM

## 2011-05-08 DIAGNOSIS — F0390 Unspecified dementia without behavioral disturbance: Secondary | ICD-10-CM

## 2011-05-08 LAB — CBC WITH DIFFERENTIAL/PLATELET
Basophils Absolute: 0 10*3/uL (ref 0.0–0.1)
Eosinophils Relative: 2 % (ref 0.0–5.0)
HCT: 36.5 % (ref 36.0–46.0)
Hemoglobin: 12.5 g/dL (ref 12.0–15.0)
Lymphocytes Relative: 33.8 % (ref 12.0–46.0)
Lymphs Abs: 1.9 10*3/uL (ref 0.7–4.0)
Monocytes Relative: 7.9 % (ref 3.0–12.0)
Neutro Abs: 3.1 10*3/uL (ref 1.4–7.7)
RBC: 3.96 Mil/uL (ref 3.87–5.11)
RDW: 13.6 % (ref 11.5–14.6)
WBC: 5.5 10*3/uL (ref 4.5–10.5)

## 2011-05-08 LAB — BASIC METABOLIC PANEL
Calcium: 9 mg/dL (ref 8.4–10.5)
GFR: 105.77 mL/min (ref >60.00)
Glucose, Bld: 119 mg/dL — ABNORMAL HIGH (ref 70–99)
Potassium: 3.7 mEq/L (ref 3.5–5.1)
Sodium: 139 mEq/L (ref 135–145)

## 2011-05-08 NOTE — Patient Instructions (Signed)
Today we updated your med list in our EPIC system...    Continue your current medications the same...  Today we did your follow up blood work...    Please call the PHONE TREE in a few days for your results...    Dial [id number] & when prompted enter your patient number followed by the # symbol...    Your patient number is:  [id number]  Call for any questions or if we can be of service in any way.Marland KitchenMarland Kitchen

## 2011-05-08 NOTE — Progress Notes (Signed)
Subjective:     Patient ID: Sonya Price, female   DOB: 04/18/1925, 78 y.o.   MRN: [id number]  HPI 78 y/o BF here for a follow up visit... she has mult med problems as noted below...   ~  Jan10:  she has no specific complaints or concerns- "I'm doing OK"... she has a severe dementia- stable... family notes that she complains of HA on occas, then forgets about it... she was adm to observ 12/29 after presenting w/ right facial symptoms that ER doc thought was TIA... eval revealed prob Bell's Palsey & confirmed by DrWillis... she was treated w/ Acyclovir orally and returns for f/u assessment...  she is improved after the Acyclovir Rx... min residual right facial weakness noted w/ smile etc... eye closes completely etc... sensation appears intact...  ~  January 26, 2010:  21 mo ROV- but saw NP 4/11 for refill meds, doing well... they have home health nurse 5d per week for 7H- requires assist w/ all ADLs, she is incontinent, still feeds self, no behavioral issues...  BP controlled on meds; no CP, palpit, ch in SOB, etc;  DM control a bit tight on her Glucovance & we will decr to 1/2 tab in AM only;  uses Tramadol for arthritic complaints...   ~  May 08, 2011:  51mo ROV & they report episode of vag bleeding, seen at [facility] ER & blood clot seen in vag vault, eval was otherw neg, nothing found & the symptom resolved;  LABS showed Hg=12.4;  Sonars were neg; they decided on watchful waiting & all symptoms resolved w/o recurrence...    <Severe Alzheimer's> pt won't look at me, won't talk to me, daughter says intermittently responsive, remains on Aricept & Namenda...    <HBP, CAD> on Labet200Bid, Amlod10; BP= 110/68 & she has no complaints, daugh confirms clinically stable...    <Chol> on diet alone & last FLP was yrs ago as they are unable to get her into office fasting; not interested in med rx given her comorbidities...    <DM> on Glucovance 2.5/500 that daught er administers based on SS> 0- 1/2- 1  depending on her sugars; random BS today= 119, A1c=5.5; I told her control was tight & she could loosen it up a tad...    <GI- HH, Divertics, IBS> stable, needs help w/ toileting, eating, etc...    <DJD, LBP> she takes ASA as needed for pain but really doesn't complain much at all...             Problem List:   STRABISMUS - she has a remarkable strabismus, but vision seems adeq per family hx & given her severe underlying dementia they have decided not to pursue this prob w/ ophthalmology.  HYPERTENSION (ICD-401.9) - on LABETOLOL 200mg Bid, NORVASC 10mg /d, off Lasix & KCl... ~  CXR 12/09 showed mild cardiomeg, tort Ao, clear lungs x basilar atx... ~  10/11: BP= 140/80 & tolerates meds well... denies HA, fatigue, visual changes, CP, palipit, dizziness, syncope, dyspnea, edema, etc... but as noted she has dementia and denies all symptoms. ~  1/13:  BP= 110/68 & she remains asymptomatic 7 doing satis according to family...  CAD (ICD-414.00) ~  Persantine Cardiolite 3/98 was norm w/o ischemia or infarct, EF=72%... ~  cath 12/99 showed Ao root dil, 2 vessel non-obstructive CAD w/ 20-30% lesions, EF=70%...  HYPERCHOLESTEROLEMIA (ICD-272.0) - prev on Zetia, now on diet alone...  family unable to bring her into the office for FASTING blood work.  DM (ICD-250.00) -  on GLUCOVANCE 2.5-500 by "sliding scale" per daughter> ie 0- 1/2- 1 depending on her sugars... ~  labs 3/09 showed BS= 96, HgA1c= 6.0. ~  labs 12/09 in hosp showed BS= 122, HgA1c= 6.2. ~  labs 10/11 showed BS= 106, A1c= 5.8.Marland KitchenMarland Kitchen this is prob too tight control & rec to decr Glucovance to 1/2 tab Qam only. ~  Labs 1/13 showed BS= 119, A1c= 5.5.[name]. daugh uses her sliding scal before giving her med...  HIATAL HERNIA (ICD-553.3) - no longer taking her PPI therapy- prev Nexium Rx... ~  last EGD was 11/96 by DrBrodie showing deformed pylorus c/w chr PUD, some gastric retension. ~  Ba swallow 11/08 was essent neg- sm HH, no  reflux...  DIVERTICULOSIS OF COLON (ICD-562.10) - prev Rx w/ Levsin, Metamucil... ~  last colonoscopy was 2/04 by DrBrodie showing divertics, no polyps...   IRRITABLE BOWEL SYNDROME (ICD-564.1)  Hx of RENAL INSUFFICIENCY (ICD-588.9) ~  labs 3/09 showed BUN= 15, Creat= 0.8 ~  labs 12/09 showed BUN= 23, Creat= 0.88 ~  labs 10/11 showed BUN= 19, Creat= 0.8 ~  Labs 1/13 showed BUN= 17, Creat= 0.7  DEGENERATIVE JOINT DISEASE (ICD-715.90) - on TRAMADOL Prn...  BACK PAIN, LUMBAR (ICD-724.2)  TIA (ICD-435.9) - on ASA 81mg /d...  ~  CT Brain 12/09 showed atherosclerotic changes, atrophy, NAD...  Hx of HEADACHE (ICD-784.0) -  prev Rx w/ on Neurontin 300Qhs... eval by DrWillis 8/08...   SENILE DEMENTIA (ICD-290.0) - on ARICEPT 10mg /d & NAMENDA 10mg Bid... family cares for pt at home and they do a beautiful job... they have to put meds in her food otherw she won't take them... ~  CT Brain 12/09 showed atrophy & sm vessel dis, no acute process...  Hx of BELL'S PALSY, RIGHT (ICD-351.0) - ** see above ** Hosp overnight 04/21/08 w/ right facial weakness c/w Bell's Palsey... Rx w/ Acyclovir and improved...  SHINGLES, HX OF (ICD-V13.8)  Hx of ANEMIA (ICD-285.9) ~  Labs 4/11 showed Hg= 11.8, MCV= 92 ~  Labs 1/13 showed Hg= 12.5, MCV= 92   Past Surgical History  Procedure Date  . Cholecystectomy   . Hiatal hernia repair     Outpatient Encounter Prescriptions as of 05/08/2011  Medication Sig Dispense Refill  . amLODipine (NORVASC) 10 MG tablet TAKE 1/2 TABLET BY MOUTH ONCE DAILY  30 tablet  4  . donepezil (ARICEPT) 10 MG tablet TAKE 1 TABLET BY MOUTH ONCE A DAY  30 tablet  7  . glyBURIDE-metformin (GLUCOVANCE) 2.5-500 MG per tablet daily with breakfast. 1/2 daily with breakfast       . labetalol (NORMODYNE) 200 MG tablet TAKE 1 TABLET BY MOUTH TWICE A DAY  60 tablet  5  . memantine (NAMENDA) 10 MG tablet Take 1 tablet (10 mg total) by mouth 2 (two) times daily.  60 tablet  0  . traMADol  (ULTRAM) 50 MG tablet TAKE 1 TABLET 4 TIMES A DAY AS NEEDED FOR ARTHRITIS PAIN  100 tablet  2    Allergies  Allergen Reactions  . Atorvastatin     REACTION: increased myalgia/CPK  . Pioglitazone     REACTION: swelling    Current Medications, Allergies, Past Medical History, Past Surgical History, Family History, and Social History were reviewed in [employer] record.   Review of Systems        See HPI - all other systems neg except as noted... The patient complains of decreased hearing, dyspnea on exertion, muscle weakness, and difficulty walking.  The patient denies  anorexia, fever, weight loss, weight gain, vision loss, hoarseness, chest pain, syncope, peripheral edema, prolonged cough, headaches, hemoptysis, abdominal pain, melena, hematochezia, severe indigestion/heartburn, hematuria, incontinence, suspicious skin lesions, transient blindness, depression, unusual weight change, abnormal bleeding, enlarged lymph nodes, and angioedema.    Objective:   Physical Exam     WD, Overweight, 78 y/o BF in NAD... GENERAL:  Alert & cooperative... she has an obvious dementia... HEENT:  [place]/AT, marked strabismus, EACs-cerumne bilaterlly,  NOSE-clear, THROAT-clear & wnl. Min residual right facial weakness seen... NECK:  Supple w/ fair ROM; no JVD; normal carotid impulses w/o bruits; no thyromegaly or nodules palpated; no lymphadenopathy. CHEST:  Clear to P & A; without wheezes/ rales/ or rhonchi. HEART:  Regular Rhythm; without murmurs/ rubs/ or gallops. ABD:  obese, soft, non-tender;  no organomegaly or masses palpated... EXT: without deformities, mod arthritic changes; no varicose veins/ +venous insuffic/ no edema. NEURO: pleasantly confused DERM:  No lesions noted; no rash etc...  RADIOLOGY DATA:  Reviewed in the EPIC EMR & discussed w/ the patient...  LABORATORY DATA:  Reviewed in the EPIC EMR & discussed w/ the patient...   Assessment:     HBP>  Controlled on  Labetolol & Amlodipine;  Continue same...  CAD>  She denies CP, palpit, SOB;  Family confirms stable w/o cardiac symptoms...  CHOL>  On diet alone;  They are un able to bring her into our lab for fasting blood work...  DM>  On Glucovance 2.5/500 which daugh administers as a SS based on BS;  With A1c at 5.5 she is a little tight & asked to lighten up a bit...  GI> HH, Divertics, IBS>  There are no GI symptoms and family confirms adeq intake, output, etc...  Renal Insuffic>  Renal function is normal attesting to good care by daughter, well hydrated, etc...  DJD, LBP>  She does not complain anymore but if she appears to be in pain daugh will Rx w/ ASA vs Tylenol...  NEURO> Senile dementia, Hx TIA>  On Aricept & Namenda; doubt much benefit at this point & asked daugh to assess need/ benefit to continuing...  Hx Anemia>  Hg is improved to 12.5, no recurrent vag bleeding etc...     Plan:     Patient's Medications  New Prescriptions   No medications on file  Previous Medications   AMLODIPINE (NORVASC) 10 MG TABLET    TAKE 1/2 TABLET BY MOUTH ONCE DAILY   GLYBURIDE-METFORMIN (GLUCOVANCE) 2.5-500 MG PER TABLET    daily with breakfast. 1/2 daily with breakfast    LABETALOL (NORMODYNE) 200 MG TABLET    TAKE 1 TABLET BY MOUTH TWICE A DAY   MEMANTINE (NAMENDA) 10 MG TABLET    Take 1 tablet (10 mg total) by mouth 2 (two) times daily.   TRAMADOL (ULTRAM) 50 MG TABLET    TAKE 1 TABLET 4 TIMES A DAY AS NEEDED FOR ARTHRITIS PAIN  Modified Medications   Modified Medication Previous Medication   DONEPEZIL (ARICEPT) 10 MG TABLET donepezil (ARICEPT) 10 MG tablet      TAKE 1 TABLET BY MOUTH ONCE A DAY    TAKE 1 TABLET BY MOUTH ONCE A DAY  Discontinued Medications   No medications on file

## 2011-06-12 ENCOUNTER — Other Ambulatory Visit: Payer: Self-pay | Admitting: Pulmonary Disease

## 2011-07-10 ENCOUNTER — Other Ambulatory Visit: Payer: Self-pay | Admitting: Pulmonary Disease

## 2011-07-12 ENCOUNTER — Other Ambulatory Visit: Payer: Self-pay | Admitting: Pulmonary Disease

## 2011-07-17 ENCOUNTER — Telehealth: Payer: Self-pay | Admitting: Pulmonary Disease

## 2011-07-17 MED ORDER — CLOTRIMAZOLE-BETAMETHASONE 1-0.05 % EX CREA: TOPICAL_CREAM | Freq: Two times a day (BID) | CUTANEOUS | Status: AC

## 2011-07-17 NOTE — Telephone Encounter (Signed)
Pt's daughter aware of new RX for Lotrisone cream and this was sent to [facility] on [address]

## 2011-07-17 NOTE — Telephone Encounter (Signed)
?   Which pharm? [facility]

## 2011-07-17 NOTE — Telephone Encounter (Signed)
I spoke with daughter and she states that pt is really raw, red and has blisters under both of her breast and on the inside of the right side leg . They have been putting gold bond powder on it. She states it's getting worse everyday and it's not healing. She is requesting further recs from SN. Please advise thanks  Allergies  Allergen Reactions  . Atorvastatin     REACTION: increased myalgia/CPK  . Pioglitazone     REACTION: swelling

## 2011-07-17 NOTE — Telephone Encounter (Signed)
Per SN---lotrisone cream #1 tube  Apply to rash bid.  With 5 refills. thanks

## 2011-07-27 ENCOUNTER — Encounter: Payer: Self-pay | Admitting: Internal Medicine

## 2011-09-29 ENCOUNTER — Other Ambulatory Visit: Payer: Self-pay | Admitting: Pulmonary Disease

## 2012-01-03 ENCOUNTER — Other Ambulatory Visit: Payer: Self-pay | Admitting: Pulmonary Disease

## 2012-02-12 ENCOUNTER — Ambulatory Visit (INDEPENDENT_AMBULATORY_CARE_PROVIDER_SITE_OTHER): Payer: 59

## 2012-02-12 DIAGNOSIS — Z23 Encounter for immunization: Secondary | ICD-10-CM

## 2012-02-14 DIAGNOSIS — Z23 Encounter for immunization: Secondary | ICD-10-CM

## 2012-02-16 ENCOUNTER — Other Ambulatory Visit: Payer: Self-pay | Admitting: Pulmonary Disease

## 2012-04-09 ENCOUNTER — Other Ambulatory Visit: Payer: Self-pay | Admitting: Adult Health

## 2012-04-09 NOTE — Telephone Encounter (Signed)
[name] McDaniel (daughter) called stating pt needs refill for amloditine-besylate 10mg .  Call daughter back @ [phone].  [facility] [address] [place]

## 2012-04-19 ENCOUNTER — Other Ambulatory Visit: Payer: Self-pay | Admitting: Pulmonary Disease

## 2012-04-22 ENCOUNTER — Telehealth: Payer: Self-pay | Admitting: Pulmonary Disease

## 2012-04-22 MED ORDER — DONEPEZIL HCL 10 MG PO TABS: 10.0000 mg | ORAL_TABLET | Freq: Once | ORAL | Status: DC

## 2012-04-22 NOTE — Telephone Encounter (Signed)
Caregiver aware rx sent.will call back after first of year for OV>  Nothing further needed.

## 2012-04-22 NOTE — Telephone Encounter (Signed)
Per sn ok to refill aricept

## 2012-04-22 NOTE — Telephone Encounter (Signed)
Pt requesting rx for aricept 10 mg take 1 tablet daily  #30 11 pt  Not scheduled for OV Allergies  Allergen Reactions  . Atorvastatin     REACTION: increased myalgia/CPK  . Pioglitazone     REACTION: swelling   Dr Kriste Basque please advise Thank you

## 2012-04-29 ENCOUNTER — Telehealth: Payer: Self-pay | Admitting: Pulmonary Disease

## 2012-04-29 NOTE — Telephone Encounter (Signed)
Per SN---sounds like gland swelling---apply heat and use lozenges----if not better she will need ov tomorrow.  thanks

## 2012-04-29 NOTE — Telephone Encounter (Signed)
Pt daughter advised. Gustavo Dispenza, CMA  

## 2012-04-29 NOTE — Telephone Encounter (Signed)
Last OV 04-2011. I spoke with the pt daughter and she states this morning she went to give the pt a bath and noticed that the right side of her face is swollen. She states it is swollen below her right ear and under her right jaw line. The pt does not have any teeth. Pt has alzheimer's so it is hard to tell if she is having pain in her ear or throat. Daughter states she is swallowing well. She states the pt does jump when you touch her face at the swelling. She denies any fever in the pt. Please advise. Carron Curie, CMA Allergies  Allergen Reactions  . Atorvastatin     REACTION: increased myalgia/CPK  . Pioglitazone     REACTION: swelling

## 2012-06-14 ENCOUNTER — Other Ambulatory Visit: Payer: Self-pay | Admitting: Pulmonary Disease

## 2012-06-20 ENCOUNTER — Other Ambulatory Visit: Payer: Self-pay | Admitting: *Deleted

## 2012-06-20 MED ORDER — GLYBURIDE-METFORMIN 2.5-500 MG PO TABS: ORAL_TABLET | ORAL | Status: DC

## 2012-08-08 ENCOUNTER — Telehealth: Payer: Self-pay | Admitting: Pulmonary Disease

## 2012-08-08 MED ORDER — METHYLPREDNISOLONE 4 MG PO KIT: PACK | ORAL | Status: DC

## 2012-08-08 MED ORDER — LEVOFLOXACIN 500 MG PO TABS: 500.0000 mg | ORAL_TABLET | Freq: Every day | ORAL | Status: DC

## 2012-08-08 NOTE — Telephone Encounter (Signed)
Called and spoke with pts daugther and she stated that the pt has been congested since Tuesday, running a fever--last night it was above 100 but she could not remember what it was.  She has been trying the robitussin dm but this does not seem to be helping.  She stated that the pt is moaning at night and she is not sure why she is doing this.  She is aware that SN is not in the office today.  Daughter is requesting something be called in for the pt.  Please advise. Thanks  Allergies  Allergen Reactions  . Atorvastatin     REACTION: increased myalgia/CPK  . Pioglitazone     REACTION: swelling

## 2012-08-08 NOTE — Telephone Encounter (Signed)
Per SN---  Ok to send in medrol dosepak  #1  Take as directed and levaquin 500mg   #7  1 daily.  [name] is aware and nothing further is needed.

## 2012-08-19 ENCOUNTER — Telehealth: Payer: Self-pay | Admitting: Pulmonary Disease

## 2012-08-19 NOTE — Telephone Encounter (Signed)
Spoke with Gerome Sam at [facility]. She states when patient FL2 was completed she forgot to have the incontinent supplies put on there. Kathie Rhodes states for patient to re qualify for CAP service she needs script that states the following:  Depends 150 per month,Under Pads 75 per month Signed and dated and faxed to [phone]  I have done this presription and faxed it over. Nothing further at this time

## 2012-08-19 NOTE — Telephone Encounter (Signed)
lmomtcb x1 for betty

## 2012-08-19 NOTE — Telephone Encounter (Signed)
Gerome Sam from health dept returning call can be reached at [phone].Raylene Everts

## 2012-09-16 ENCOUNTER — Other Ambulatory Visit: Payer: Self-pay | Admitting: Pulmonary Disease

## 2012-10-23 ENCOUNTER — Other Ambulatory Visit: Payer: Self-pay | Admitting: Pulmonary Disease

## 2012-10-31 ENCOUNTER — Telehealth: Payer: Self-pay | Admitting: Pulmonary Disease

## 2012-10-31 DIAGNOSIS — G459 Transient cerebral ischemic attack, unspecified: Secondary | ICD-10-CM

## 2012-10-31 DIAGNOSIS — F0390 Unspecified dementia without behavioral disturbance: Secondary | ICD-10-CM

## 2012-10-31 DIAGNOSIS — G510 Bell's palsy: Secondary | ICD-10-CM

## 2012-10-31 NOTE — Telephone Encounter (Signed)
Spoke with caller-States that the patient gets enough chux pads for 2/day but uses 4/day as she puts them on her bed and chair when sitting-would like an order faxed to [facility] at [facility] [phone](fax#).  Caller then stated that the patient needs a new wheelchair with leg lifts and longer arms as her current wheelchair is broken. Believes they used [facility] in the past for this.  Lastly, patient has not been seen since 04-2011 and caller was told that the patient needs to be seen as its been a 1.5. Leigh please advise where to put patient on schedule.   Caller is aware she will not receive a follow up call today.

## 2012-11-01 NOTE — Telephone Encounter (Signed)
Called and spoke with pts daughter and she is aware of appt on Monday with SN.  She is aware that order has been placed for wheelchair with [facility] and i have sent [facility] a staff message and i have have faxed the order to the health dept   To betty to get the increase in her chux per month. Nothing further is needed.

## 2012-11-04 ENCOUNTER — Encounter: Payer: Self-pay | Admitting: Pulmonary Disease

## 2012-11-04 ENCOUNTER — Ambulatory Visit (INDEPENDENT_AMBULATORY_CARE_PROVIDER_SITE_OTHER): Payer: 59 | Admitting: Pulmonary Disease

## 2012-11-04 ENCOUNTER — Other Ambulatory Visit (INDEPENDENT_AMBULATORY_CARE_PROVIDER_SITE_OTHER): Payer: 59

## 2012-11-04 VITALS — BP 140/74 | HR 71 | Temp 97.9°F | Ht 61.0 in | Wt 150.0 lb

## 2012-11-04 DIAGNOSIS — I2510 Atherosclerotic heart disease of native coronary artery without angina pectoris: Secondary | ICD-10-CM

## 2012-11-04 DIAGNOSIS — D649 Anemia, unspecified: Secondary | ICD-10-CM

## 2012-11-04 DIAGNOSIS — E119 Type 2 diabetes mellitus without complications: Secondary | ICD-10-CM

## 2012-11-04 DIAGNOSIS — I10 Essential (primary) hypertension: Secondary | ICD-10-CM

## 2012-11-04 DIAGNOSIS — M545 Low back pain: Secondary | ICD-10-CM

## 2012-11-04 DIAGNOSIS — K5730 Diverticulosis of large intestine without perforation or abscess without bleeding: Secondary | ICD-10-CM

## 2012-11-04 DIAGNOSIS — F0390 Unspecified dementia without behavioral disturbance: Secondary | ICD-10-CM

## 2012-11-04 DIAGNOSIS — M1990 Unspecified osteoarthritis, unspecified site: Secondary | ICD-10-CM

## 2012-11-04 DIAGNOSIS — G510 Bell's palsy: Secondary | ICD-10-CM

## 2012-11-04 DIAGNOSIS — G459 Transient cerebral ischemic attack, unspecified: Secondary | ICD-10-CM

## 2012-11-04 DIAGNOSIS — K449 Diaphragmatic hernia without obstruction or gangrene: Secondary | ICD-10-CM

## 2012-11-04 DIAGNOSIS — E7800 Pure hypercholesterolemia, unspecified: Secondary | ICD-10-CM

## 2012-11-04 LAB — CBC WITH DIFFERENTIAL/PLATELET
Basophils Absolute: 0 10*3/uL (ref 0.0–0.1)
Eosinophils Relative: 1.2 % (ref 0.0–5.0)
HCT: 36.3 % (ref 36.0–46.0)
Lymphs Abs: 1.7 10*3/uL (ref 0.7–4.0)
Monocytes Absolute: 0.4 10*3/uL (ref 0.1–1.0)
Monocytes Relative: 7.3 % (ref 3.0–12.0)
Neutrophils Relative %: 61.8 % (ref 43.0–77.0)
Platelets: 194 10*3/uL (ref 150.0–400.0)
RDW: 13.9 % (ref 11.5–14.6)
WBC: 5.9 10*3/uL (ref 4.5–10.5)

## 2012-11-04 LAB — HEPATIC FUNCTION PANEL
ALT: 13 U/L (ref 0–35)
AST: 21 U/L (ref 0–37)
Albumin: 3.3 g/dL — ABNORMAL LOW (ref 3.5–5.2)
Alkaline Phosphatase: 62 U/L (ref 39–117)

## 2012-11-04 LAB — BASIC METABOLIC PANEL
BUN: 15 mg/dL (ref 6–23)
CO2: 27 mEq/L (ref 19–32)
Chloride: 110 mEq/L (ref 96–112)
Glucose, Bld: 142 mg/dL — ABNORMAL HIGH (ref 70–99)
Potassium: 3.2 mEq/L — ABNORMAL LOW (ref 3.5–5.1)
Sodium: 140 mEq/L (ref 135–145)

## 2012-11-04 LAB — TSH: TSH: 1.78 u[IU]/mL (ref 0.35–5.50)

## 2012-11-04 LAB — HEMOGLOBIN A1C: Hgb A1c MFr Bld: 5.7 % (ref 4.6–6.5)

## 2012-11-04 NOTE — Progress Notes (Signed)
Subjective:     Patient ID: Sonya Price, female   DOB: 11-06-1924, 79 y.o.   MRN: [id number]  HPI 79 y/o BF here for a follow up visit... she has mult med problems as noted below...   ~  Jan10:  she has no specific complaints or concerns- "I'm doing OK"... she has a severe dementia- stable... family notes that she complains of HA on occas, then forgets about it... she was adm to observ 12/29 after presenting w/ right facial symptoms that ER doc thought was TIA... eval revealed prob Bell's Palsey & confirmed by DrWillis... she was treated w/ Acyclovir orally and returns for f/u assessment...  she is improved after the Acyclovir Rx... min residual right facial weakness noted w/ smile etc... eye closes completely etc... sensation appears intact...  ~  January 26, 2010:  21 mo ROV- but saw NP 4/11 for refill meds, doing well... they have home health nurse 5d per week for 7H- requires assist w/ all ADLs, she is incontinent, still feeds self, no behavioral issues...  BP controlled on meds; no CP, palpit, ch in SOB, etc;  DM control a bit tight on her Glucovance & we will decr to 1/2 tab in AM only;  uses Tramadol for arthritic complaints...   ~  May 08, 2011:  24mo ROV & they report episode of vag bleeding, seen at [facility] ER & blood clot seen in vag vault, eval was otherw neg, nothing found & the symptom resolved;  LABS showed Hg=12.4;  Sonars were neg; they decided on watchful waiting & all symptoms resolved w/o recurrence...    Severe Alzheimer's> pt won't look at me, won't talk to me, daughter says intermittently responsive, remains on Aricept & Namenda...    HBP, CAD> on Labet200Bid, Amlod10; BP= 110/68 & she has no complaints, daugh confirms clinically stable...    Chol> on diet alone & last FLP was yrs ago as they are unable to get her into office fasting; not interested in med rx given her comorbidities...    DM> on Glucovance 2.5/500 that daught er administers based on SS> 0- 1/2- 1 depending  on her sugars; random BS today= 119, A1c=5.5; I told her control was tight & she could loosen it up a tad...    GI- HH, Divertics, IBS> stable, needs help w/ toileting, eating, etc...    DJD, LBP> she takes ASA as needed for pain but really doesn't complain much at all...  ~  November 04, 2012:  84mo ROV & brought in by daughter for check> pt is poorly responsive w/ severe dementia, virtually mute w/ occas vocalizations, daugh notes intertrig rash (Rx Lotrisone cream prn), pt has to be fed, keep eyes closed much of the time, but no complaints!    Severe Alzheimer's> pt won't look at me, won't talk to me, daughter says intermittently responsive, remains on Aricept & Namenda; DNR & MOST form completed.    HBP, CAD> on Labet200Bid, Amlod10; BP= 140/74 & she has no complaints, daugh confirms clinically stable...    Chol> on diet alone & last FLP was yrs ago as they are unable to get her into office fasting; not interested in med rx given her comorbidities...    DM> on Glucovance 2.5/500 that daughter administers based on SS> 0- 1/2- 1 depending on her sugars; random BS today= 142, A1c=5.7; I told her control was still tight & she could loosen it up a little...    GI- HH, Divertics, IBS> stable, needs  help w/ toileting, eating, etc...    DJD, LBP> she takes ASA as needed for pain but really doesn't complain much at all... We reviewed prob list, meds, xrays and labs> see below for updates >>  LABS 7/14:  Chems- ok x K=3.2 & we added K20/d, BS=142 A1c=5.7 on Glucov 1/2 tab prn only;  CBC- ok w/ Hg=12.1;  TSH=1.78;  VitD=24 & rc to add MVI + 2000uVitD...              Problem List:   STRABISMUS - she has a remarkable strabismus, but vision seems adeq per family hx & given her severe underlying dementia they have decided not to pursue this prob w/ ophthalmology.  HYPERTENSION (ICD-401.9) - on LABETOLOL 200mg Bid, NORVASC 10mg /d, off Lasix & KCl... ~  CXR 12/09 showed mild cardiomeg, tort Ao, clear lungs x  basilar atx... ~  10/11: BP= 140/80 & tolerates meds well... denies HA, fatigue, visual changes, CP, palipit, dizziness, syncope, dyspnea, edema, etc... but as noted she has dementia and denies all symptoms. ~  1/13:  BP= 110/68 & she remains asymptomatic 7 doing satis according to family... ~  7/14: on Labet200Bid, Amlod10; BP= 140/74 & she has no complaints, daugh confirms clinically stable  CAD (ICD-414.00) ~  Persantine Cardiolite 3/98 was norm w/o ischemia or infarct, EF=72%... ~  cath 12/99 showed Ao root dil, 2 vessel non-obstructive CAD w/ 20-30% lesions, EF=70%...  HYPERCHOLESTEROLEMIA (ICD-272.0) - prev on Zetia, now on diet alone...  family unable to bring her into the office for FASTING blood work.  DM (ICD-250.00) - on GLUCOVANCE 2.5-500 by "sliding scale" per daughter> ie 0- 1/2- 1 depending on her sugars... ~  labs 3/09 showed BS= 96, HgA1c= 6.0. ~  labs 12/09 in hosp showed BS= 122, HgA1c= 6.2. ~  labs 10/11 showed BS= 106, A1c= 5.8.Marland KitchenMarland Kitchen this is prob too tight control & rec to decr Glucovance to 1/2 tab Qam only. ~  Labs 1/13 showed BS= 119, A1c= 5.5.[name]. daugh uses her sliding scal before giving her med... ~  7/14: on Glucovance 2.5/500 that daughter administers based on SS> 0- 1/2- 1 depending on her sugars; random BS today= 142, A1c=5.7; I told her control was still tight & she could loosen it up a little.  HIATAL HERNIA (ICD-553.3) - no longer taking her PPI therapy- prev Nexium Rx... ~  last EGD was 11/96 by DrBrodie showing deformed pylorus c/w chr PUD, some gastric retension. ~  Ba swallow 11/08 was essent neg- sm HH, no reflux...  DIVERTICULOSIS OF COLON (ICD-562.10) - prev Rx w/ Levsin, Metamucil... ~  last colonoscopy was 2/04 by DrBrodie showing divertics, no polyps...   IRRITABLE BOWEL SYNDROME (ICD-564.1)  Hx of RENAL INSUFFICIENCY (ICD-588.9) ~  labs 3/09 showed BUN= 15, Creat= 0.8 ~  labs 12/09 showed BUN= 23, Creat= 0.88 ~  labs 10/11 showed BUN= 19, Creat=  0.8 ~  Labs 1/13 showed BUN= 17, Creat= 0.7 ~  Labs 7/14 showed BUN= 15, Cr= 0.7  DEGENERATIVE JOINT DISEASE (ICD-715.90) - on TRAMADOL Prn...  BACK PAIN, LUMBAR (ICD-724.2)  VITAMIN D DEFICIENCY >>  ~  Labs 7/14 showed VitD level = 24 & they are rec to take Women's formula MVI & Vit D supplement ~2000u daily...  TIA (ICD-435.9) - on ASA 81mg /d...  ~  CT Brain 12/09 showed atherosclerotic changes, atrophy, NAD.[name]. ~ 7/14:  Daughter denies any cerebral ischemic symptoms...  Hx of HEADACHE (ICD-784.0) -  prev Rx w/ on Neurontin 300Qhs... eval by DrWillis  8/08.Marland KitchenMarland Kitchen   SENILE DEMENTIA (ICD-290.0) - on ARICEPT 10mg /d & NAMENDA 10mg Bid... family cares for pt at home and they do a beautiful job... they have to put meds in her food otherw she won't take them... ~  CT Brain 12/09 showed atrophy & sm vessel dis, no acute process... ~  Clinically she has progressive severe senile dementia...  Hx of BELL'S PALSY, RIGHT (ICD-351.0) - ** see above ** Hosp overnight 04/21/08 w/ right facial weakness c/w Bell's Palsey... Rx w/ Acyclovir and improved...  SHINGLES, HX OF (ICD-V13.8)  Hx of ANEMIA (ICD-285.9) ~  Labs 4/11 showed Hg= 11.8, MCV= 92 ~  Labs 1/13 showed Hg= 12.5, MCV= 92 ~  Labs 7/14 showed Hg= 12.1   Past Surgical History  Procedure Laterality Date  . Cholecystectomy    . Hiatal hernia repair      Outpatient Encounter Prescriptions as of 11/04/2012  Medication Sig Dispense Refill  . amLODipine (NORVASC) 10 MG tablet TAKE 1/2 TABLET BY MOUTH ONCE DAILY  30 tablet  2  . donepezil (ARICEPT) 10 MG tablet Take 1 tablet (10 mg total) by mouth once. TAKE 1 TABLET BY MOUTH ONCE A DAY  30 tablet  7  . glyBURIDE-metformin (GLUCOVANCE) 2.5-500 MG per tablet TAKE 1 TABLET BY MOUTH TWICE A DAY  180 tablet  4  . labetalol (NORMODYNE) 200 MG tablet TAKE 1 TABLET BY MOUTH TWICE A DAY  60 tablet  5  . levofloxacin (LEVAQUIN) 500 MG tablet Take 1 tablet (500 mg total) by mouth daily.  7 tablet  0  .  methylPREDNISolone (MEDROL, PAK,) 4 MG tablet follow package directions  21 tablet  0  . NAMENDA 10 MG tablet TAKE 1 TABLET BY MOUTH TWICE DAILY.  60 tablet  1  . traMADol (ULTRAM) 50 MG tablet TAKE 1 TABLET 4 TIMES A DAY AS NEEDED FOR ARTHRITIS PAIN  100 tablet  2   No facility-administered encounter medications on file as of 11/04/2012.    Allergies  Allergen Reactions  . Atorvastatin     REACTION: increased myalgia/CPK  . Pioglitazone     REACTION: swelling    Current Medications, Allergies, Past Medical History, Past Surgical History, Family History, and Social History were reviewed in [employer] record.   Review of Systems        See HPI - all other systems neg except as noted... The patient complains of decreased hearing, dyspnea on exertion, muscle weakness, and difficulty walking.  The patient denies anorexia, fever, weight loss, weight gain, vision loss, hoarseness, chest pain, syncope, peripheral edema, prolonged cough, headaches, hemoptysis, abdominal pain, melena, hematochezia, severe indigestion/heartburn, hematuria, incontinence, suspicious skin lesions, transient blindness, depression, unusual weight change, abnormal bleeding, enlarged lymph nodes, and angioedema.    Objective:   Physical Exam     WD, Overweight, 79 y/o BF in NAD... GENERAL:  Lethargic & poorly responsive- she has an obvious dementia... HEENT:  [place]/AT, marked strabismus, EACs-cerumne bilaterlly,  NOSE-clear, THROAT-clear & wnl. Min residual right facial weakness seen... NECK:  Supple w/ fair ROM; no JVD; normal carotid impulses w/o bruits; no thyromegaly or nodules palpated; no lymphadenopathy. CHEST:  Clear to P & A; without wheezes/ rales/ or rhonchi. HEART:  Regular Rhythm; without murmurs/ rubs/ or gallops. ABD:  obese, soft, non-tender;  no organomegaly or masses palpated... EXT: without deformities, mod arthritic changes; no varicose veins/ +venous insuffic/ no  edema. NEURO: pleasantly confused DERM:  No lesions noted; no rash etc...  RADIOLOGY DATA:  Reviewed in the [facility] EMR & discussed w/ the patient...  LABORATORY DATA:  Reviewed in the EPIC EMR & discussed w/ the patient...   Assessment:      HBP>  Controlled on Labetolol & Amlodipine;  Continue same...  CAD>  She denies CP, palpit, SOB;  Family confirms stable w/o cardiac symptoms...  CHOL>  On diet alone;  They are un able to bring her into our lab for fasting blood work...  DM>  On Glucovance 2.5/500 which daugh administers as a SS based on BS;  With A1c at 5.7 she is a little tight & asked to lighten up a bit...  GI> HH, Divertics, IBS>  There are no GI symptoms and family confirms adeq intake, output, etc...  Renal Insuffic>  Renal function is normal attesting to good care by daughter, well hydrated, etc...  DJD, LBP>  She does not complain anymore but if she appears to be in pain daugh will Rx w/ ASA vs Tylenol...  NEURO> Senile dementia, Hx TIA>  On Aricept & Namenda; doubt much benefit at this point & asked daugh to assess need/ benefit to continuing...  Hx Anemia>  Hg is improved to 12.1, no recurrent vag bleeding etc...     Plan:     Patient's Medications  New Prescriptions   POTASSIUM CHLORIDE (K-DUR) 10 MEQ TABLET    Take 2 tablets by mouth daily  Previous Medications   AMLODIPINE (NORVASC) 10 MG TABLET    TAKE 1/2 TABLET BY MOUTH ONCE DAILY   CHOLECALCIFEROL (VITAMIN D) 2000 UNITS CAPS    Take 1 capsule by mouth daily.   MULTIPLE VITAMINS-MINERALS (WOMENS MULTIVITAMIN PLUS PO)    Take 1 tablet by mouth daily.   NAMENDA 10 MG TABLET    TAKE 1 TABLET BY MOUTH TWICE DAILY.  Modified Medications   Modified Medication Previous Medication   DONEPEZIL (ARICEPT) 10 MG TABLET donepezil (ARICEPT) 10 MG tablet      TAKE 1 TABLET BY MOUTH ONCE A DAY    Take 1 tablet (10 mg total) by mouth once. TAKE 1 TABLET BY MOUTH ONCE A DAY   GLYBURIDE-METFORMIN (GLUCOVANCE) 2.5-500 MG  PER TABLET glyBURIDE-metformin (GLUCOVANCE) 2.5-500 MG per tablet      TAKE 1/2 TABLET BY MOUTH in the mornings    TAKE 1 TABLET BY MOUTH TWICE A DAY   LABETALOL (NORMODYNE) 200 MG TABLET labetalol (NORMODYNE) 200 MG tablet      TAKE 1 TABLET BY MOUTH TWICE A DAY    TAKE 1 TABLET BY MOUTH TWICE A DAY   TRAMADOL (ULTRAM) 50 MG TABLET traMADol (ULTRAM) 50 MG tablet      TAKE 1 TABLET 4 TIMES A DAY AS NEEDED FOR ARTHRITIS PAIN    TAKE 1 TABLET 4 TIMES A DAY AS NEEDED FOR ARTHRITIS PAIN  Discontinued Medications   LEVOFLOXACIN (LEVAQUIN) 500 MG TABLET    Take 1 tablet (500 mg total) by mouth daily.   METHYLPREDNISOLONE (MEDROL, PAK,) 4 MG TABLET    follow package directions

## 2012-11-04 NOTE — Patient Instructions (Addendum)
Today we updated your med list in our EPIC system...    Continue your current medications the same...  Today we did your follow up blood work...    We will contact you w/ the results when available...   Today we completed the MOST Form together - Medical Orders for Scope of Treatment...    It is my understanding that Sonya Price does not want CPR resuscitation if her heart was to stop and prefers comfort measures but we would pursue the use of IV fluids, tube feedings, & antibiotics if they were needed for a period of time...    Please let us know if you have any DME needs so we can assit w/ the paper work...  Call for any questions...  Let's plan a follow up visit in 52yr, sooner if needed for problems.Marland KitchenMarland Kitchen

## 2012-11-05 ENCOUNTER — Other Ambulatory Visit: Payer: Self-pay | Admitting: Pulmonary Disease

## 2012-11-05 MED ORDER — POTASSIUM CHLORIDE ER 10 MEQ PO TBCR: EXTENDED_RELEASE_TABLET | ORAL | Status: DC

## 2012-11-25 ENCOUNTER — Other Ambulatory Visit: Payer: Self-pay | Admitting: Pulmonary Disease

## 2012-12-16 ENCOUNTER — Other Ambulatory Visit: Payer: Self-pay | Admitting: Pulmonary Disease

## 2012-12-27 ENCOUNTER — Encounter: Payer: Self-pay | Admitting: Pulmonary Disease

## 2013-02-11 ENCOUNTER — Ambulatory Visit (INDEPENDENT_AMBULATORY_CARE_PROVIDER_SITE_OTHER): Payer: PRIVATE HEALTH INSURANCE

## 2013-02-11 DIAGNOSIS — Z23 Encounter for immunization: Secondary | ICD-10-CM

## 2013-02-12 DIAGNOSIS — Z23 Encounter for immunization: Secondary | ICD-10-CM

## 2013-03-07 ENCOUNTER — Other Ambulatory Visit: Payer: Self-pay | Admitting: *Deleted

## 2013-03-07 MED ORDER — LABETALOL HCL 200 MG PO TABS: ORAL_TABLET | ORAL | Status: AC

## 2013-03-07 MED ORDER — DONEPEZIL HCL 10 MG PO TABS: ORAL_TABLET | ORAL | Status: DC

## 2013-03-15 ENCOUNTER — Other Ambulatory Visit: Payer: Self-pay | Admitting: Pulmonary Disease

## 2013-04-15 ENCOUNTER — Other Ambulatory Visit: Payer: Self-pay | Admitting: Pulmonary Disease

## 2013-04-16 IMAGING — US US PELVIS COMPLETE
1 series · 13 of 25 positions shown · non-contrast
Comparison: None.

CLINICAL DATA: Postmenopausal bleeding

TRANSABDOMINAL AND TRANSVAGINAL ULTRASOUND OF PELVIS
TECHNIQUE: Both transabdominal and transvaginal ultrasound
examinations of the pelvis were performed. Transabdominal technique
was performed for global imaging of the pelvis including uterus,
ovaries, adnexal regions, and pelvic cul-de-sac.

[Series 1: us pelvis complete · 13 of 63 slices shown]
[im 1/63]
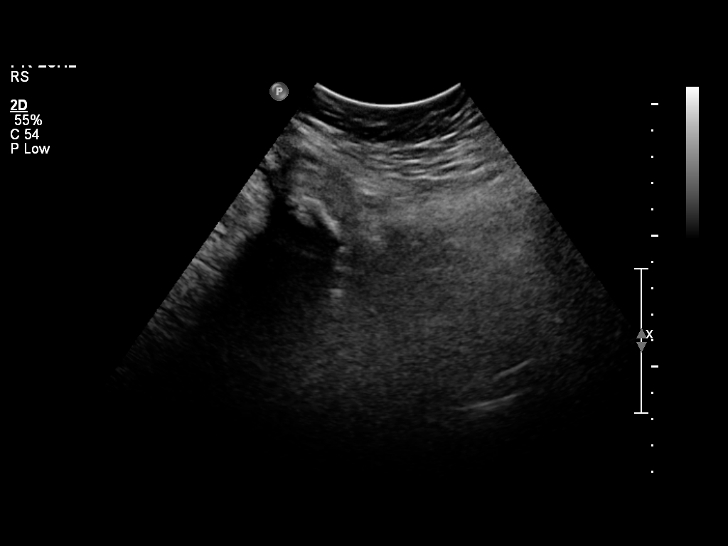
[im 6/63]
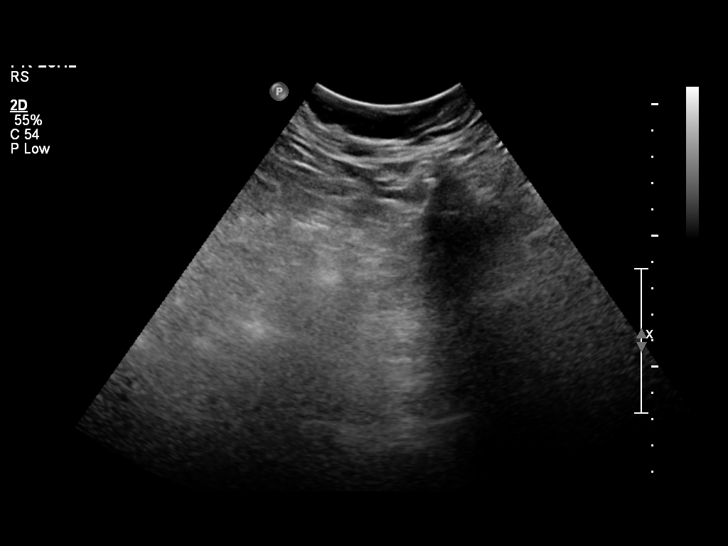
[im 11/63]
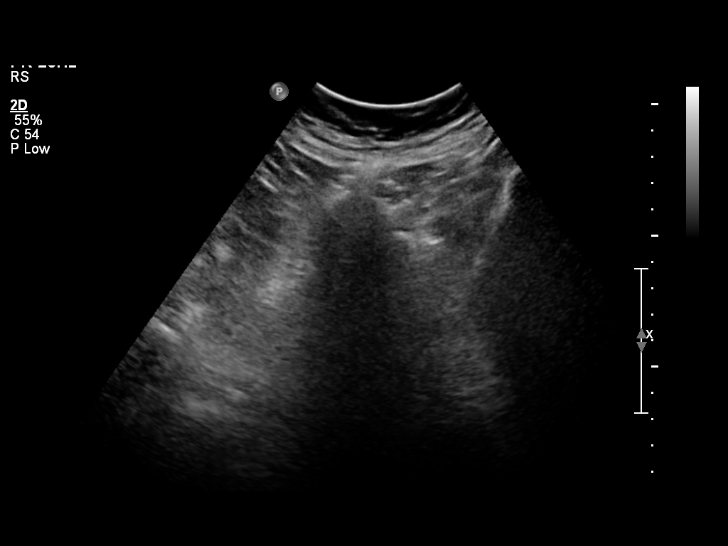
[im 16/63]
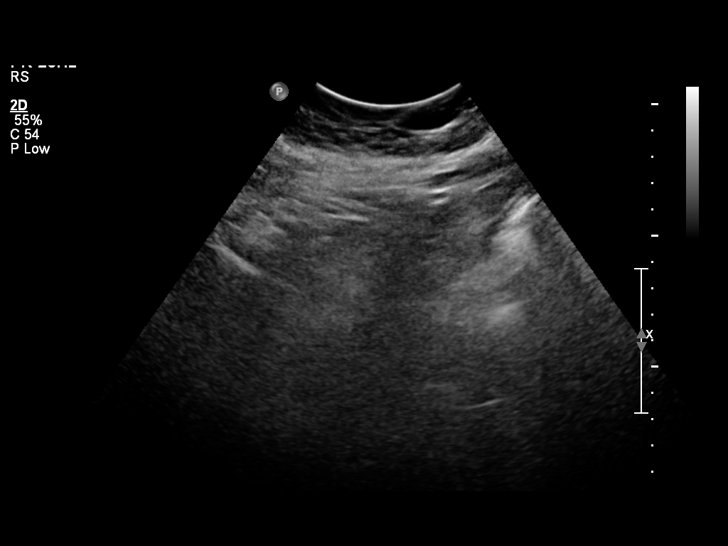
[im 21/63]
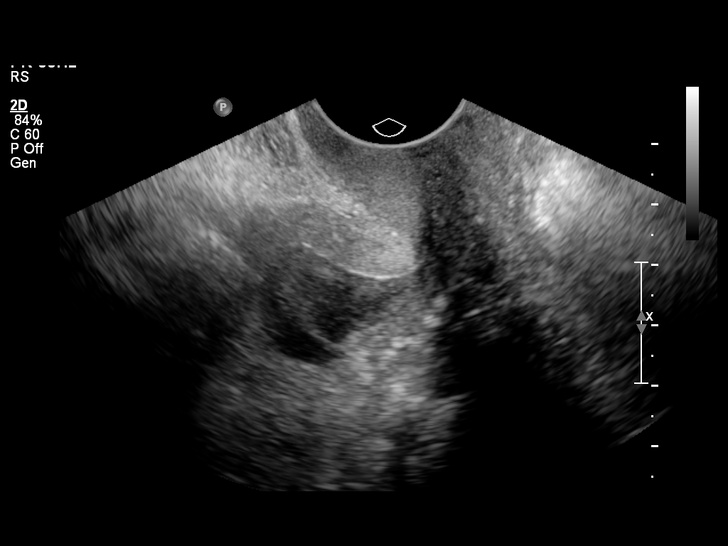
[im 26/63]
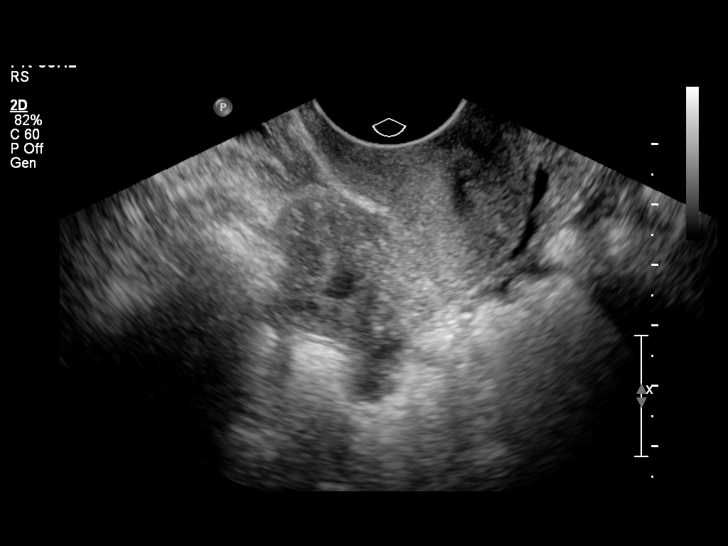
[im 32/63]
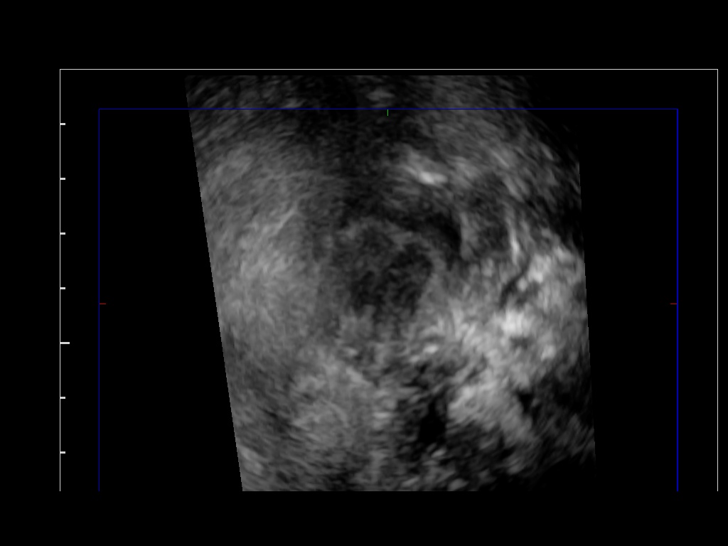
[im 37/63]
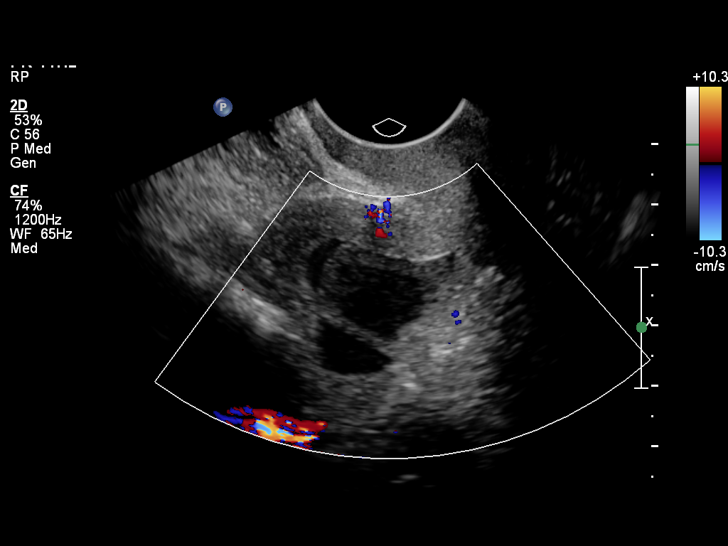
[im 42/63]
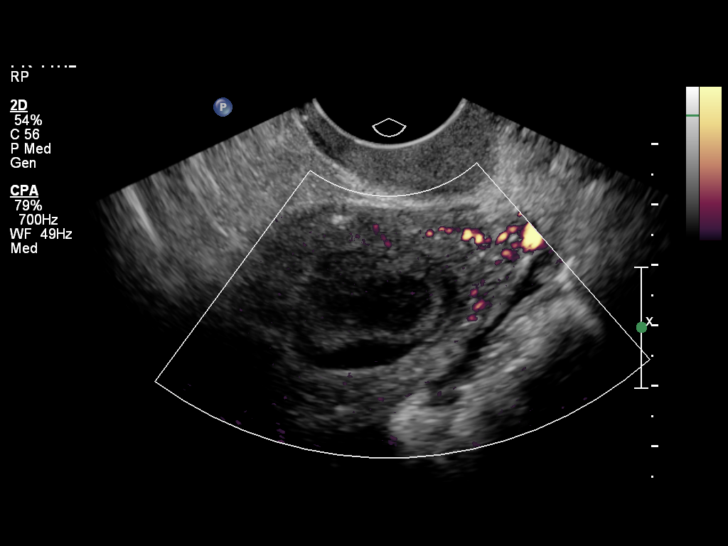
[im 47/63]
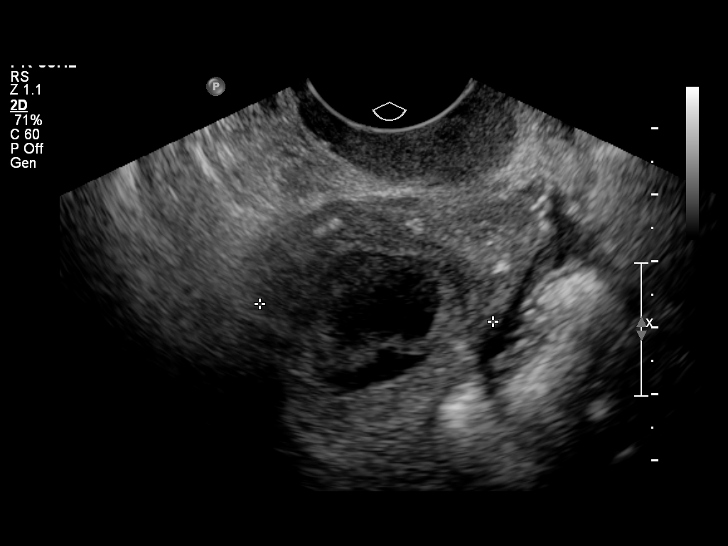
[im 52/63]
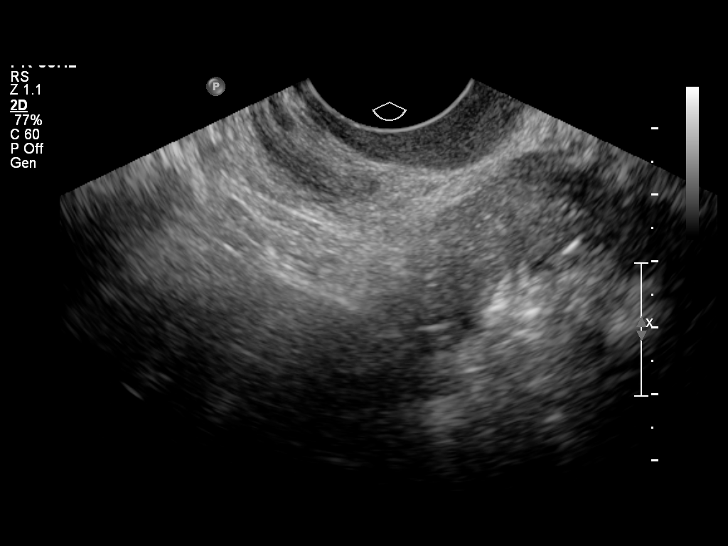
[im 57/63]
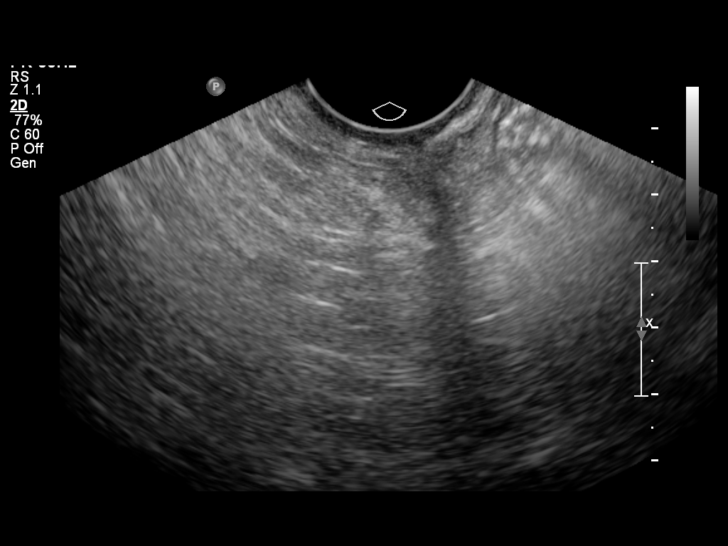
[im 63/63]
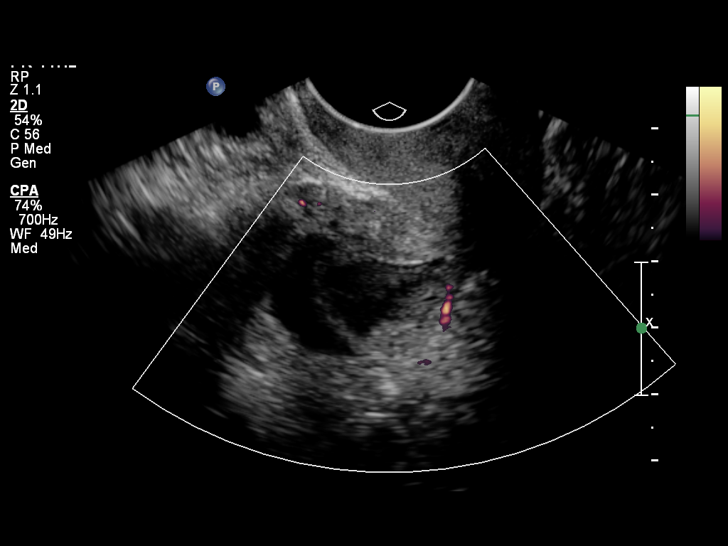

[13 of 25 positions shown; findings below may reference images not displayed]

It was necessary to proceed with endovaginal exam following the
transabdominal exam to visualize the myometrium, endometrium and
ovaries.
FINDINGS: Uterus: measures 6.5 cm in length, 4.8 cm in depth and 3.5 cm in
width. A homogeneous myometrium is seen. The cervix appears normal.

Endometrium: is distended by simple fluid to a width of 2.3 cm and
contains a lobulated hypoechoic, avascular soft tissue density
overlying the internal os. The appearance is suspicious for a clot.
The endometrial lining appears thin with no focal thickening
suggested

Right ovary:  Not seen either transabdominally or endovaginally.

Left ovary: Not seen either transabdominally or endovaginally.

Other findings: No free fluid or separate adnexal masses are seen.
IMPRESSION: Distended endometrial canal with both simple fluid and soft tissue
suspicious for clot. No definite focal endometrial abnormality is
seen and this may be the result of some retention due to relative
cervical stenosis given the normal appearance of the cervix.

Nonvisualized ovaries.

## 2013-06-03 ENCOUNTER — Telehealth: Payer: Self-pay | Admitting: Pulmonary Disease

## 2013-06-03 DIAGNOSIS — G510 Bell's palsy: Secondary | ICD-10-CM

## 2013-06-03 DIAGNOSIS — M545 Low back pain, unspecified: Secondary | ICD-10-CM

## 2013-06-03 NOTE — Telephone Encounter (Signed)
Pt is needing a lift to help get her in and out of bed. [facility]  SN - please advise. Thanks.

## 2013-06-03 NOTE — Telephone Encounter (Signed)
Called and spoke with daughter. Aware order placed. Nothing further needed

## 2013-06-03 NOTE — Telephone Encounter (Signed)
Per SN---  [facility]  thanks

## 2013-06-03 NOTE — Telephone Encounter (Signed)
I called and spoke with Sonya Price. She reports in order to get a hoyer lift pt has to be totally disabled. They could set her up with bed rails (not lifts). Please advise SN thanks

## 2013-06-03 NOTE — Telephone Encounter (Signed)
Per SN----  Family called with this request.   Will need to have [facility] contact the pts family about what their needs are to care for the pt at home.

## 2013-06-03 NOTE — Telephone Encounter (Signed)
Called and spoke with Melissa. Aware of recs. They will call family

## 2013-06-04 ENCOUNTER — Telehealth: Payer: Self-pay | Admitting: Pulmonary Disease

## 2013-06-04 NOTE — Telephone Encounter (Signed)
Called and spoke with pt daughter. appt scheduled. Nothing further needed

## 2013-06-04 NOTE — Telephone Encounter (Signed)
Called and spoke with pt daughter. Needing face to face appt with SN to get a hoyer lift. Please advise where pt can be worked in? thanks

## 2013-06-04 NOTE — Telephone Encounter (Signed)
Ok to add pt on for 2-19  Appt for the face to face.  [facility] stated that the pts insurance will not cover the lift unless the pt is totally disabled.  [facility] could set the pt up with bed rails.  thanks

## 2013-06-12 ENCOUNTER — Encounter: Payer: Self-pay | Admitting: Pulmonary Disease

## 2013-06-12 ENCOUNTER — Other Ambulatory Visit (INDEPENDENT_AMBULATORY_CARE_PROVIDER_SITE_OTHER): Payer: 59

## 2013-06-12 ENCOUNTER — Ambulatory Visit (INDEPENDENT_AMBULATORY_CARE_PROVIDER_SITE_OTHER): Payer: 59 | Admitting: Pulmonary Disease

## 2013-06-12 VITALS — BP 130/72 | HR 66 | Ht 61.0 in

## 2013-06-12 DIAGNOSIS — K449 Diaphragmatic hernia without obstruction or gangrene: Secondary | ICD-10-CM

## 2013-06-12 DIAGNOSIS — I10 Essential (primary) hypertension: Secondary | ICD-10-CM

## 2013-06-12 DIAGNOSIS — G459 Transient cerebral ischemic attack, unspecified: Secondary | ICD-10-CM

## 2013-06-12 DIAGNOSIS — E119 Type 2 diabetes mellitus without complications: Secondary | ICD-10-CM

## 2013-06-12 DIAGNOSIS — E7800 Pure hypercholesterolemia, unspecified: Secondary | ICD-10-CM

## 2013-06-12 DIAGNOSIS — M1990 Unspecified osteoarthritis, unspecified site: Secondary | ICD-10-CM

## 2013-06-12 DIAGNOSIS — F0390 Unspecified dementia without behavioral disturbance: Secondary | ICD-10-CM

## 2013-06-12 DIAGNOSIS — I2510 Atherosclerotic heart disease of native coronary artery without angina pectoris: Secondary | ICD-10-CM

## 2013-06-12 DIAGNOSIS — D649 Anemia, unspecified: Secondary | ICD-10-CM

## 2013-06-12 DIAGNOSIS — K5730 Diverticulosis of large intestine without perforation or abscess without bleeding: Secondary | ICD-10-CM

## 2013-06-12 LAB — BASIC METABOLIC PANEL
BUN: 14 mg/dL (ref 6–23)
CALCIUM: 9.6 mg/dL (ref 8.4–10.5)
CO2: 21 meq/L (ref 19–32)
Chloride: 110 mEq/L (ref 96–112)
Creatinine, Ser: 0.7 mg/dL (ref 0.4–1.2)
GFR: 101.78 mL/min (ref >60.00)
Glucose, Bld: 146 mg/dL — ABNORMAL HIGH (ref 70–99)
Potassium: 3.8 mEq/L (ref 3.5–5.1)
SODIUM: 139 meq/L (ref 135–145)

## 2013-06-12 LAB — HEMOGLOBIN A1C: Hgb A1c MFr Bld: 5.6 % (ref 4.6–6.5)

## 2013-06-12 NOTE — Patient Instructions (Signed)
Today we updated your med list in our EPIC system...    Continue your current medications the same...  Today we did a face-to-face visit required by medicare to certify the Sonya Price is totally disabled...    [name]...  Today we checked her follow up DM labs...    We will contact you w/ the results when available...   Call for any questions.[name]..[name]

## 2013-06-12 NOTE — Progress Notes (Signed)
Subjective:     Patient ID: Sonya Price, female   DOB: 1924/05/17, 80 y.o.   MRN: [id number]  HPI 80 y/o BF here for a follow up visit... she has mult med problems as noted below...   ~  May 08, 2011:  76mo ROV & they report episode of vag bleeding, seen at [facility] ER & blood clot seen in vag vault, eval was otherw neg, nothing found & the symptom resolved;  LABS showed Hg=12.4;  Sonars were neg; they decided on watchful waiting & all symptoms resolved w/o recurrence...    Severe Alzheimer's> pt won't look at me, won't talk to me, daughter says intermittently responsive, remains on Aricept & Namenda...    HBP, CAD> on Labet200Bid, Amlod10; BP= 110/68 & she has no complaints, daugh confirms clinically stable...    Chol> on diet alone & last FLP was yrs ago as they are unable to get her into office fasting; not interested in med rx given her comorbidities...    DM> on Glucovance 2.5/500 that daught er administers based on SS> 0- 1/2- 1 depending on her sugars; random BS today= 119, A1c=5.5; I told her control was tight & she could loosen it up a tad...    GI- HH, Divertics, IBS> stable, needs help w/ toileting, eating, etc...    DJD, LBP> she takes ASA as needed for pain but really doesn't complain much at all...  ~  November 04, 2012:  61mo ROV & brought in by daughter for check> pt is poorly responsive w/ severe dementia, virtually mute w/ occas vocalizations, daugh notes intertrig rash (Rx Lotrisone cream prn), pt has to be fed, keep eyes closed much of the time, but no complaints!    Severe Alzheimer's> pt won't look at me, won't talk to me, daughter says intermittently responsive, remains on Aricept & Namenda; DNR & MOST form completed.    HBP, CAD> on Labet200Bid, Amlod10; BP= 140/74 & she has no complaints, daugh confirms clinically stable...    Chol> on diet alone & last FLP was yrs ago as they are unable to get her into office fasting; not interested in med rx given her comorbidities...    DM> on Glucovance 2.5/500 that daughter administers based on SS> 0- 1/2- 1 depending on her sugars; random BS today= 142, A1c=5.7; I told her control was still tight & she could loosen it up a little...    GI- HH, Divertics, IBS> stable, needs help w/ toileting, eating, etc...    DJD, LBP> she takes ASA as needed for pain but really doesn't complain much at all... We reviewed prob list, meds, xrays and labs> see below for updates >>   LABS 7/14:  Chems- ok x K=3.2 & we added K20/d, BS=142 A1c=5.7 on Glucov 1/2 tab prn only;  CBC- ok w/ Hg=12.1;  TSH=1.78;  VitD=24 & rc to add MVI + 2000uVitD...  ~  June 12, 2013:  42mo ROV & daughter brought her in for face-to-face so she can get a Hoyer-Lift to aid in her home care needs... She notes that mom can't stand, can't walk, can't sit up on her own, drools, has to be fed, incontinent & wears depends, etc; she is a total care pt & considered permanently & totally disabled;  She is a NCB, comfort care only...     Severe Alzheimer's> pt won't look at me, won't talk to me, daughter says intermittently responsive, remains on Aricept10 & Namenda10Bid; DNR & MOST form prev completed.  HBP, CAD> on Labet200Bid, Amlod11/2tab, K10-2/d; BP= 130/72 & she has no complaints, daugh confirms clinically stable...    Chol> on diet alone & last FLP was yrs ago as they are unable to get her into office fasting; not interested in med rx given her comorbidities...    DM> on Glucovance 2.5/500 that daughter administers based on SS> 0- 1/2- 1 depending on her sugars; random BS today= 146, A1c=5.6; I told her control was still tight & she could loosen up the SS a little...    GI- HH, Divertics, IBS> stable, needs help w/ toileting, eating, etc...    DJD, LBP> she takes ASA as needed for pain but really doesn't complain much at all... We reviewed prob list, meds, xrays and labs> see below for updates >> Rx for Depends, chux pads, OK Hoyer lift...  LABS 2/15:  Chems- ok w/  BS=146, A1c=5.6, BUN=14, Cr=0.7, K=3.8.[name].[name].           Problem List:   STRABISMUS - she has a remarkable strabismus, but vision seems adeq per family hx & given her severe underlying dementia they have decided not to pursue this prob w/ ophthalmology.  HYPERTENSION (ICD-401.9) - on LABETOLOL 200mg Bid, NORVASC 10mg /d, off Lasix & KCl... ~  CXR 12/09 showed mild cardiomeg, tort Ao, clear lungs x basilar atx... ~  10/11: BP= 140/80 & tolerates meds well... denies HA, fatigue, visual changes, CP, palipit, dizziness, syncope, dyspnea, edema, etc... but as noted she has dementia and denies all symptoms. ~  1/13:  BP= 110/68 & she remains asymptomatic 7 doing satis according to family... ~  7/14: on Labet200Bid, Amlod10; BP= 140/74 & she has no complaints, daugh confirms clinically stable ~  2/15: on Labet200Bid, Amlod11/2tab, K10-2/d; BP= 130/72 & she has no complaints, daugh confirms clinically stable  CAD (ICD-414.00) ~  Persantine Cardiolite 3/98 was norm w/o ischemia or infarct, EF=72%... ~  cath 12/99 showed Ao root dil, 2 vessel non-obstructive CAD w/ 20-30% lesions, EF=70%...  HYPERCHOLESTEROLEMIA (ICD-272.0) - prev on Zetia, now on diet alone...  family unable to bring her into the office for FASTING blood work.  DM (ICD-250.00) - on GLUCOVANCE 2.5-500 by "sliding scale" per daughter> ie 0- 1/2- 1 depending on her sugars... ~  labs 3/09 showed BS= 96, HgA1c= 6.0. ~  labs 12/09 in hosp showed BS= 122, HgA1c= 6.2. ~  labs 10/11 showed BS= 106, A1c= 5.8.[name].[name]. this is prob too tight control & rec to decr Glucovance to 1/2 tab Qam only. ~  Labs 1/13 showed BS= 119, A1c= 5.5.[name].. daugh uses her sliding scal before giving her med... ~  7/14: on Glucovance 2.5/500 that daughter administers based on SS> 0- 1/2- 1 depending on her sugars; random BS today= 142, A1c=5.7; I told her control was still tight & she could loosen it up a little. ~  2/15: on Glucovance 2.5/500 that daughter administers based on  SS> 0- 1/2- 1 depending on her sugars; random BS today= 146, A1c=5.6  HIATAL HERNIA (ICD-553.3) - no longer taking her PPI therapy- prev Nexium Rx... ~  last EGD was 11/96 by DrBrodie showing deformed pylorus c/w chr PUD, some gastric retension. ~  Ba swallow 11/08 was essent neg- sm HH, no reflux...  DIVERTICULOSIS OF COLON (ICD-562.10) - prev Rx w/ Levsin, Metamucil... ~  last colonoscopy was 2/04 by DrBrodie showing divertics, no polyps...   IRRITABLE BOWEL SYNDROME (ICD-564.1)  Hx of RENAL INSUFFICIENCY (ICD-588.9) ~  labs 3/09 showed BUN= 15, Creat= 0.8 ~  labs 12/09 showed BUN= 23, Creat= 0.88 ~  labs 10/11 showed BUN= 19, Creat= 0.8 ~  Labs 1/13 showed BUN= 17, Creat= 0.7 ~  Labs 7/14 showed BUN= 15, Cr= 0.7 ~  Labs 2/15 showed BUN= 14, Cr= 0.7  DEGENERATIVE JOINT DISEASE (ICD-715.90) - on TRAMADOL Prn...  BACK PAIN, LUMBAR (ICD-724.2)  VITAMIN D DEFICIENCY >>  ~  Labs 7/14 showed VitD level = 24 & they are rec to take Women's formula MVI & Vit D supplement ~2000u daily...  TIA (ICD-435.9) - on ASA 81mg /d...  ~  CT Brain 12/09 showed atherosclerotic changes, atrophy, NAD.[name]. ~ 7/14:  Daughter denies any cerebral ischemic symptoms...  Hx of HEADACHE (ICD-784.0) -  prev Rx w/ on Neurontin 300Qhs... eval by DrWillis 8/08...   SENILE DEMENTIA (ICD-290.0) - on ARICEPT 10mg /d & NAMENDA 10mg Bid... family cares for pt at home and they do a beautiful job... they have to put meds in her food otherw she won't take them... ~  CT Brain 12/09 showed atrophy & sm vessel dis, no acute process... ~  Clinically she has progressive severe senile dementia...  Hx of BELL'S PALSY, RIGHT (ICD-351.0) - ** see above ** Hosp overnight 04/21/08 w/ right facial weakness c/w Bell's Palsey... Rx w/ Acyclovir and improved...  SHINGLES, HX OF (ICD-V13.8)  Hx of ANEMIA (ICD-285.9) ~  Labs 4/11 showed Hg= 11.8, MCV= 92 ~  Labs 1/13 showed Hg= 12.5, MCV= 92 ~  Labs 7/14 showed Hg= 12.1   Past  Surgical History  Procedure Laterality Date  . Cholecystectomy    . Hiatal hernia repair      Outpatient Encounter Prescriptions as of 06/12/2013  Medication Sig  . amLODipine (NORVASC) 10 MG tablet TAKE 1/2 TABLET BY MOUTH ONCE DAILY  . Cholecalciferol (VITAMIN D) 2000 UNITS CAPS Take 1 capsule by mouth daily.  [name] donepezil (ARICEPT) 10 MG tablet TAKE 1 TABLET BY MOUTH ONCE A DAY  . glyBURIDE-metformin (GLUCOVANCE) 2.5-500 MG per tablet TAKE 1/2 TABLET BY MOUTH in the mornings  . labetalol (NORMODYNE) 200 MG tablet TAKE 1 TABLET BY MOUTH TWICE A DAY  . Multiple Vitamins-Minerals (WOMENS MULTIVITAMIN PLUS PO) Take 1 tablet by mouth daily.  [name] NAMENDA 10 MG tablet TAKE 1 TABLET BY MOUTH TWICE DAILY.  [name] potassium chloride (K-DUR) 10 MEQ tablet Take 2 tablets by mouth daily  . traMADol (ULTRAM) 50 MG tablet TAKE 1 TABLET 4 TIMES A DAY AS NEEDED FOR ARTHRITIS PAIN    Allergies  Allergen Reactions  . Atorvastatin     REACTION: increased myalgia/CPK  . Pioglitazone     REACTION: swelling    Current Medications, Allergies, Past Medical History, Past Surgical History, Family History, and Social History were reviewed in [employer] record.   Review of Systems        See HPI - all other systems neg except as noted... The patient complains of decreased hearing, dyspnea on exertion, muscle weakness, and difficulty walking.  The patient denies anorexia, fever, weight loss, weight gain, vision loss, hoarseness, chest pain, syncope, peripheral edema, prolonged cough, headaches, hemoptysis, abdominal pain, melena, hematochezia, severe indigestion/heartburn, hematuria, incontinence, suspicious skin lesions, transient blindness, depression, unusual weight change, abnormal bleeding, enlarged lymph nodes, and angioedema.    Objective:   Physical Exam     WD, Overweight, 80 y/o BF in NAD... GENERAL:  Lethargic & poorly responsive- she has an obvious dementia... HEENT:  [place]/AT,  marked strabismus, EACs-cerumne bilaterlly,  NOSE-clear, THROAT-clear & wnl. Min residual right  facial weakness seen... NECK:  Supple w/ fair ROM; no JVD; normal carotid impulses w/o bruits; no thyromegaly or nodules palpated; no lymphadenopathy. CHEST:  Clear to P & A; without wheezes/ rales/ or rhonchi. HEART:  Regular Rhythm; without murmurs/ rubs/ or gallops. ABD:  obese, soft, non-tender;  no organomegaly or masses palpated... EXT: without deformities, mod arthritic changes; no varicose veins/ +venous insuffic/ no edema. NEURO: pleasantly confused DERM:  No lesions noted; no rash etc...  RADIOLOGY DATA:  Reviewed in the EPIC EMR & discussed w/ the patient...  LABORATORY DATA:  Reviewed in the EPIC EMR & discussed w/ the patient...   Assessment:      HBP>  Controlled on Labetolol & Amlodipine;  Continue same...  CAD>  She denies CP, palpit, SOB;  Family confirms stable w/o cardiac symptoms...  CHOL>  On diet alone;  They are un able to bring her into our lab for fasting blood work...  DM>  On Glucovance 2.5/500 which daugh administers as a SS based on BS;  With A1c at 5.7 she is a little tight & asked to lighten up a bit...  GI> HH, Divertics, IBS>  There are no GI symptoms and family confirms adeq intake, output, etc...  Renal Insuffic>  Renal function is normal attesting to good care by daughter, well hydrated, etc...  DJD, LBP>  She does not complain anymore but if she appears to be in pain daugh will Rx w/ ASA vs Tylenol...  NEURO> Senile dementia, Hx TIA>  On Aricept & Namenda; doubt much benefit at this point & asked daugh to assess need/ benefit to continuing...  Hx Anemia>  Hg is improved to 12.1, no recurrent vag bleeding etc...     Plan:     Patient's Medications  New Prescriptions   No medications on file  Previous Medications   AMLODIPINE (NORVASC) 10 MG TABLET    TAKE 1/2 TABLET BY MOUTH ONCE DAILY   CHOLECALCIFEROL (VITAMIN D) 2000 UNITS CAPS    Take 1  capsule by mouth daily.   DONEPEZIL (ARICEPT) 10 MG TABLET    TAKE 1 TABLET BY MOUTH ONCE A DAY   GLYBURIDE-METFORMIN (GLUCOVANCE) 2.5-500 MG PER TABLET    TAKE 1/2 TABLET BY MOUTH in the mornings   LABETALOL (NORMODYNE) 200 MG TABLET    TAKE 1 TABLET BY MOUTH TWICE A DAY   MULTIPLE VITAMINS-MINERALS (WOMENS MULTIVITAMIN PLUS PO)    Take 1 tablet by mouth daily.   NAMENDA 10 MG TABLET    TAKE 1 TABLET BY MOUTH TWICE DAILY.   POTASSIUM CHLORIDE (K-DUR) 10 MEQ TABLET    Take 2 tablets by mouth daily   TRAMADOL (ULTRAM) 50 MG TABLET    TAKE 1 TABLET 4 TIMES A DAY AS NEEDED FOR ARTHRITIS PAIN  Modified Medications   No medications on file  Discontinued Medications   No medications on file

## 2013-06-23 ENCOUNTER — Telehealth: Payer: Self-pay | Admitting: Pulmonary Disease

## 2013-06-23 NOTE — Telephone Encounter (Signed)
Called and spoke with pts daughter and she is aware that once SN has completed the OV note we will fax this and [facility].  Nothing further is needed.

## 2013-07-08 ENCOUNTER — Telehealth: Payer: Self-pay | Admitting: Pulmonary Disease

## 2013-07-08 NOTE — Telephone Encounter (Signed)
[facility]. [facility]. Nothing further needed

## 2013-07-22 ENCOUNTER — Telehealth: Payer: Self-pay | Admitting: Pulmonary Disease

## 2013-07-22 NOTE — Telephone Encounter (Signed)
i called and made [place] aware. Nothing further needed

## 2013-07-22 NOTE — Telephone Encounter (Signed)
Called and spoke with [place]. She wants to know if SN feels like pt would be able to travel to [place]. It [place] about 9 hr drive. They figure this would be more comfortable for pt and would make sure she keeps feel elevated. Please advise SN thanks

## 2013-07-22 NOTE — Telephone Encounter (Signed)
Per SN----  Yes i suspect its ok.  Be sure to stop and get out and walk every 100 miles or so.  Drink plenty of water.

## 2013-08-22 ENCOUNTER — Telehealth: Payer: Self-pay | Admitting: Pulmonary Disease

## 2013-08-22 NOTE — Telephone Encounter (Signed)
Called and lmom to make Sonya Price aware of the fax that has been faxed back today for the pt.  Nothing further is needed.

## 2013-08-22 NOTE — Telephone Encounter (Signed)
Form has been refaxed to [place]Sonya Price and i called her and she did receive this. Nothing further is needed.

## 2013-09-06 ENCOUNTER — Other Ambulatory Visit: Payer: Self-pay | Admitting: Pulmonary Disease

## 2013-11-04 ENCOUNTER — Other Ambulatory Visit (INDEPENDENT_AMBULATORY_CARE_PROVIDER_SITE_OTHER): Payer: 59

## 2013-11-04 ENCOUNTER — Ambulatory Visit (INDEPENDENT_AMBULATORY_CARE_PROVIDER_SITE_OTHER): Payer: PRIVATE HEALTH INSURANCE | Admitting: Pulmonary Disease

## 2013-11-04 VITALS — BP 120/70 | HR 60 | Temp 98.0°F | Ht 61.0 in

## 2013-11-04 DIAGNOSIS — G459 Transient cerebral ischemic attack, unspecified: Secondary | ICD-10-CM

## 2013-11-04 DIAGNOSIS — D649 Anemia, unspecified: Secondary | ICD-10-CM

## 2013-11-04 DIAGNOSIS — I10 Essential (primary) hypertension: Secondary | ICD-10-CM

## 2013-11-04 DIAGNOSIS — K5730 Diverticulosis of large intestine without perforation or abscess without bleeding: Secondary | ICD-10-CM

## 2013-11-04 DIAGNOSIS — K449 Diaphragmatic hernia without obstruction or gangrene: Secondary | ICD-10-CM

## 2013-11-04 DIAGNOSIS — E7800 Pure hypercholesterolemia, unspecified: Secondary | ICD-10-CM

## 2013-11-04 DIAGNOSIS — I2510 Atherosclerotic heart disease of native coronary artery without angina pectoris: Secondary | ICD-10-CM

## 2013-11-04 DIAGNOSIS — E119 Type 2 diabetes mellitus without complications: Secondary | ICD-10-CM

## 2013-11-04 DIAGNOSIS — M1990 Unspecified osteoarthritis, unspecified site: Secondary | ICD-10-CM

## 2013-11-04 DIAGNOSIS — F0390 Unspecified dementia without behavioral disturbance: Secondary | ICD-10-CM

## 2013-11-04 LAB — BASIC METABOLIC PANEL
BUN: 13 mg/dL (ref 6–23)
CHLORIDE: 110 meq/L (ref 96–112)
CO2: 21 mEq/L (ref 19–32)
Calcium: 9.5 mg/dL (ref 8.4–10.5)
Creatinine, Ser: 0.6 mg/dL (ref 0.4–1.2)
GFR: 116.97 mL/min (ref >60.00)
GLUCOSE: 218 mg/dL — AB (ref 70–99)
POTASSIUM: 3.7 meq/L (ref 3.5–5.1)
SODIUM: 140 meq/L (ref 135–145)

## 2013-11-04 LAB — HEPATIC FUNCTION PANEL
ALK PHOS: 62 U/L (ref 39–117)
ALT: 13 U/L (ref 0–35)
AST: 23 U/L (ref 0–37)
Albumin: 3.3 g/dL — ABNORMAL LOW (ref 3.5–5.2)
Bilirubin, Direct: 0 mg/dL (ref 0.0–0.3)
Total Bilirubin: 0.4 mg/dL (ref 0.2–1.2)
Total Protein: 6.9 g/dL (ref 6.0–8.3)

## 2013-11-04 LAB — HEMOGLOBIN A1C: HEMOGLOBIN A1C: 5.6 % (ref 4.6–6.5)

## 2013-11-04 LAB — CBC WITH DIFFERENTIAL/PLATELET
BASOS ABS: 0 10*3/uL (ref 0.0–0.1)
Basophils Relative: 0.6 % (ref 0.0–3.0)
Eosinophils Absolute: 0.1 10*3/uL (ref 0.0–0.7)
Eosinophils Relative: 3.2 % (ref 0.0–5.0)
HCT: 36.7 % (ref 36.0–46.0)
Hemoglobin: 12.2 g/dL (ref 12.0–15.0)
LYMPHS ABS: 1.3 10*3/uL (ref 0.7–4.0)
Lymphocytes Relative: 30.9 % (ref 12.0–46.0)
MCHC: 33.4 g/dL (ref 30.0–36.0)
MCV: 92.4 fl (ref 78.0–100.0)
MONOS PCT: 7.1 % (ref 3.0–12.0)
Monocytes Absolute: 0.3 10*3/uL (ref 0.1–1.0)
NEUTROS PCT: 58.2 % (ref 43.0–77.0)
Neutro Abs: 2.5 10*3/uL (ref 1.4–7.7)
PLATELETS: 218 10*3/uL (ref 150.0–400.0)
RBC: 3.97 Mil/uL (ref 3.87–5.11)
RDW: 13.8 % (ref 11.5–15.5)
WBC: 4.3 10*3/uL (ref 4.0–10.5)

## 2013-11-04 LAB — TSH: TSH: 1.6 u[IU]/mL (ref 0.35–4.50)

## 2013-11-04 NOTE — Patient Instructions (Signed)
Today we updated your med list in our EPIC system...    Continue your current medications the same...  Let us know if you need any refills...  Today we did your follow up blood work...    We will contact you w/ the results when available...   Keep up the amazing job of home care that you are doing...    and let us know if we can be of assistance in any way.[name]..[name]

## 2013-11-07 ENCOUNTER — Other Ambulatory Visit: Payer: Self-pay | Admitting: Pulmonary Disease

## 2013-11-08 ENCOUNTER — Encounter: Payer: Self-pay | Admitting: Pulmonary Disease

## 2013-11-08 NOTE — Progress Notes (Signed)
Subjective:     Patient ID: Sonya Price, female   DOB: 1924/05/17, 80 y.o.   MRN: [id number]  HPI 80 y/o BF here for a follow up visit... she has mult med problems as noted below...   ~  May 08, 2011:  76mo ROV & they report episode of vag bleeding, seen at [facility] ER & blood clot seen in vag vault, eval was otherw neg, nothing found & the symptom resolved;  LABS showed Hg=12.4;  Sonars were neg; they decided on watchful waiting & all symptoms resolved w/o recurrence...    Severe Alzheimer's> pt won't look at me, won't talk to me, daughter says intermittently responsive, remains on Aricept & Namenda...    HBP, CAD> on Labet200Bid, Amlod10; BP= 110/68 & she has no complaints, daugh confirms clinically stable...    Chol> on diet alone & last FLP was yrs ago as they are unable to get her into office fasting; not interested in med rx given her comorbidities...    DM> on Glucovance 2.5/500 that daught er administers based on SS> 0- 1/2- 1 depending on her sugars; random BS today= 119, A1c=5.5; I told her control was tight & she could loosen it up a tad...    GI- HH, Divertics, IBS> stable, needs help w/ toileting, eating, etc...    DJD, LBP> she takes ASA as needed for pain but really doesn't complain much at all...  ~  November 04, 2012:  61mo ROV & brought in by daughter for check> pt is poorly responsive w/ severe dementia, virtually mute w/ occas vocalizations, daugh notes intertrig rash (Rx Lotrisone cream prn), pt has to be fed, keep eyes closed much of the time, but no complaints!    Severe Alzheimer's> pt won't look at me, won't talk to me, daughter says intermittently responsive, remains on Aricept & Namenda; DNR & MOST form completed.    HBP, CAD> on Labet200Bid, Amlod10; BP= 140/74 & she has no complaints, daugh confirms clinically stable...    Chol> on diet alone & last FLP was yrs ago as they are unable to get her into office fasting; not interested in med rx given her comorbidities...    DM> on Glucovance 2.5/500 that daughter administers based on SS> 0- 1/2- 1 depending on her sugars; random BS today= 142, A1c=5.7; I told her control was still tight & she could loosen it up a little...    GI- HH, Divertics, IBS> stable, needs help w/ toileting, eating, etc...    DJD, LBP> she takes ASA as needed for pain but really doesn't complain much at all... We reviewed prob list, meds, xrays and labs> see below for updates >>   LABS 7/14:  Chems- ok x K=3.2 & we added K20/d, BS=142 A1c=5.7 on Glucov 1/2 tab prn only;  CBC- ok w/ Hg=12.1;  TSH=1.78;  VitD=24 & rc to add MVI + 2000uVitD...  ~  June 12, 2013:  42mo ROV & daughter brought her in for face-to-face so she can get a Hoyer-Lift to aid in her home care needs... She notes that mom can't stand, can't walk, can't sit up on her own, drools, has to be fed, incontinent & wears depends, etc; she is a total care pt & considered permanently & totally disabled;  She is a NCB, comfort care only...     Severe Alzheimer's> pt won't look at me, won't talk to me, daughter says intermittently responsive, remains on Aricept10 & Namenda10Bid; DNR & MOST form prev completed.  HBP, CAD> on Labet200Bid, Amlod11/2tab, K10-2/d; BP= 130/72 & she has no complaints, daugh confirms clinically stable...    Chol> on diet alone & last FLP was yrs ago as they are unable to get her into office fasting; not interested in med rx given her comorbidities...    DM> on Glucovance 2.5/500 that daughter administers based on SS> 0- 1/2- 1 depending on her sugars; random BS today= 146, A1c=5.6; I told her control was still tight & she could loosen up the SS a little...    GI- HH, Divertics, IBS> stable, needs help w/ toileting, eating, etc...    DJD, LBP> she takes ASA as needed for pain but really doesn't complain much at all... We reviewed prob list, meds, xrays and labs> see below for updates >> Rx for Depends, chux pads, OK Hoyer lift...  LABS 2/15:  Chems- ok w/  BS=146, A1c=5.6, BUN=14, Cr=0.7, K=3.8.[name].[name].  ~  November 04, 2013:  27mo ROV & Sonya Price is stable, cared for very well by her daughter; Sonya Price has very severe end-stage Alz dis & is virtually non-communicative, total care patient... The hoyer lift has really helped w/ her care at home...     Severe Alzheimer's> pt won't look at me, won't talk to me, daughter says intermittently responsive, remains on Aricept10 & Namenda10Bid; DNR & MOST form prev completed.    HBP, CAD> on Labet200Bid, Amlod5, K10-2/d; BP= 120/70 & she has no complaints, daugh confirms clinically stable...    Chol> on diet alone & last FLP was yrs ago as they are unable to get her into office fasting; not interested in med rx given her comorbidities...    DM> on Glucovance 2.5/500 that daughter administers based on SS> 0- 1/2- 1 depending on her sugars; Labs today showed BS=218, A1c=5.6    GI- HH, Divertics, IBS> stable, needs help w/ toileting, eating, etc...    DJD, LBP> she takes ASA as needed for pain but really doesn't complain much at all... We reviewed prob list, meds, xrays and labs> see below for updates >>   LABS 7/15:  Chems- ok x BS=218, A1c=5.6, Alb=3.3;  CBC- wnl w/ Hg=12.2;  TSH=1.60...           Problem List:   STRABISMUS - she has a remarkable strabismus, but vision seems adeq per family hx & given her severe underlying dementia they have decided not to pursue this prob w/ ophthalmology.  HYPERTENSION (ICD-401.9) - on LABETOLOL 200mg Bid, NORVASC 10mg /d, off Lasix & KCl... ~  CXR 12/09 showed mild cardiomeg, tort Ao, clear lungs x basilar atx... ~  10/11: BP= 140/80 & tolerates meds well... denies HA, fatigue, visual changes, CP, palipit, dizziness, syncope, dyspnea, edema, etc... but as noted she has dementia and denies all symptoms. ~  1/13:  BP= 110/68 & she remains asymptomatic 7 doing satis according to family... ~  7/14: on Labet200Bid, Amlod10; BP= 140/74 & she has no complaints, daugh confirms clinically stable ~   2/15: on Labet200Bid, Amlod11/2tab, K10-2/d; BP= 130/72 & she has no complaints, daugh confirms clinically stable ~  7/15: on Labet200Bid, Amlod5, K10-2/d; BP= 120/70 & she has no complaints, daugh confirms clinically stable  CAD (ICD-414.00) ~  Persantine Cardiolite 3/98 was norm w/o ischemia or infarct, EF=72%... ~  cath 12/99 showed Ao root dil, 2 vessel non-obstructive CAD w/ 20-30% lesions, EF=70%...  HYPERCHOLESTEROLEMIA (ICD-272.0) - prev on Zetia, now on diet alone...  family unable to bring her into the office for FASTING blood work.  DM (  ICD-250.00) - on GLUCOVANCE 2.5-500 by "sliding scale" per daughter> ie 0- 1/2- 1 depending on her sugars... ~  labs 3/09 showed BS= 96, HgA1c= 6.0. ~  labs 12/09 in hosp showed BS= 122, HgA1c= 6.2. ~  labs 10/11 showed BS= 106, A1c= 5.8.Marland KitchenMarland Kitchen this is prob too tight control & rec to decr Glucovance to 1/2 tab Qam only. ~  Labs 1/13 showed BS= 119, A1c= 5.5.[name]. daugh uses her sliding scal before giving her med... ~  7/14: on Glucovance 2.5/500 that daughter administers based on SS> 0- 1/2- 1 depending on her sugars; random BS today= 142, A1c=5.7; I told her control was still tight & she could loosen it up a little. ~  2/15: on Glucovance 2.5/500 that daughter administers based on SS> 0- 1/2- 1 depending on her sugars; random BS today= 146, A1c=5.6 ~  7/15: on Glucovance 2.5/500 that daughter administers based on SS> 0- 1/2- 1 depending on her sugars; Labs today showed BS=218, A1c=5.6  HIATAL HERNIA (ICD-553.3) - no longer taking her PPI therapy- prev Nexium Rx... ~  last EGD was 11/96 by DrBrodie showing deformed pylorus c/w chr PUD, some gastric retension. ~  Ba swallow 11/08 was essent neg- sm HH, no reflux...  DIVERTICULOSIS OF COLON (ICD-562.10) - prev Rx w/ Levsin, Metamucil... ~  last colonoscopy was 2/04 by DrBrodie showing divertics, no polyps...   IRRITABLE BOWEL SYNDROME (ICD-564.1)  Hx of RENAL INSUFFICIENCY (ICD-588.9) ~  labs 3/09 showed  BUN= 15, Creat= 0.8 ~  labs 12/09 showed BUN= 23, Creat= 0.88 ~  labs 10/11 showed BUN= 19, Creat= 0.8 ~  Labs 1/13 showed BUN= 17, Creat= 0.7 ~  Labs 7/14 showed BUN= 15, Cr= 0.7 ~  Labs 2/15 showed BUN= 14, Cr= 0.7 ~  Labs 7/15 showed BUN= 13, Cr= 0.6  DEGENERATIVE JOINT DISEASE (ICD-715.90) - on TRAMADOL Prn...  BACK PAIN, LUMBAR (ICD-724.2)  VITAMIN D DEFICIENCY >>  ~  Labs 7/14 showed VitD level = 24 & they are rec to take Women's formula MVI & Vit D supplement ~2000u daily...  TIA (ICD-435.9) - on ASA 81mg /d...  ~  CT Brain 12/09 showed atherosclerotic changes, atrophy, NAD.[name]. ~ 7/14:  Daughter denies any cerebral ischemic symptoms...  Hx of HEADACHE (ICD-784.0) -  prev Rx w/ on Neurontin 300Qhs... eval by DrWillis 8/08...   SENILE DEMENTIA (ICD-290.0) - on ARICEPT 10mg /d & NAMENDA 10mg Bid... family cares for pt at home and they do a beautiful job... they have to put meds in her food otherw she won't take them... ~  CT Brain 12/09 showed atrophy & sm vessel dis, no acute process... ~  Clinically she has progressive severe senile dementia & she is a total care pt...  Hx of BELL'S PALSY, RIGHT (ICD-351.0) - ** see above ** Hosp overnight 04/21/08 w/ right facial weakness c/w Bell's Palsey... Rx w/ Acyclovir and improved...  SHINGLES, HX OF (ICD-V13.8)  Hx of ANEMIA (ICD-285.9) ~  Labs 4/11 showed Hg= 11.8, MCV= 92 ~  Labs 1/13 showed Hg= 12.5, MCV= 92 ~  Labs 7/14 showed Hg= 12.1 ~  Labs 7/15 showed Hg= 12.2   Past Surgical History  Procedure Laterality Date  . Cholecystectomy    . Hiatal hernia repair      Outpatient Encounter Prescriptions as of 11/04/2013  Medication Sig  . amLODipine (NORVASC) 10 MG tablet TAKE 1/2 TABLET BY MOUTH ONCE DAILY  . Cholecalciferol (VITAMIN D) 2000 UNITS CAPS Take 1 capsule by mouth daily.  [name] donepezil (ARICEPT) 10  MG tablet TAKE 1 TABLET BY MOUTH ONCE A DAY  . glyBURIDE-metformin (GLUCOVANCE) 2.5-500 MG per tablet TAKE 1/2 TABLET BY  MOUTH in the mornings  . labetalol (NORMODYNE) 200 MG tablet TAKE 1 TABLET BY MOUTH TWICE A DAY  . Multiple Vitamins-Minerals (WOMENS MULTIVITAMIN PLUS PO) Take 1 tablet by mouth daily.  [name] NAMENDA 10 MG tablet TAKE 1 TABLET BY MOUTH TWICE DAILY.  . traMADol (ULTRAM) 50 MG tablet TAKE 1 TABLET 4 TIMES A DAY AS NEEDED FOR ARTHRITIS PAIN  . [DISCONTINUED] potassium chloride (K-DUR) 10 MEQ tablet Take 2 tablets by mouth daily    Allergies  Allergen Reactions  . Atorvastatin     REACTION: increased myalgia/CPK  . Pioglitazone     REACTION: swelling    Current Medications, Allergies, Past Medical History, Past Surgical History, Family History, and Social History were reviewed in [employer] record.   Review of Systems        See HPI - all other systems neg except as noted... The patient complains of decreased hearing, dyspnea on exertion, muscle weakness, and difficulty walking.  The patient denies anorexia, fever, weight loss, weight gain, vision loss, hoarseness, chest pain, syncope, peripheral edema, prolonged cough, headaches, hemoptysis, abdominal pain, melena, hematochezia, severe indigestion/heartburn, hematuria, incontinence, suspicious skin lesions, transient blindness, depression, unusual weight change, abnormal bleeding, enlarged lymph nodes, and angioedema.    Objective:   Physical Exam     WD, Overweight, 80 y/o BF in NAD... GENERAL:  Lethargic & poorly responsive- she has an obvious dementia... HEENT:  [place]/AT, marked strabismus, EACs-cerumne bilaterlly,  NOSE-clear, THROAT-clear & wnl. Min residual right facial weakness seen... NECK:  Supple w/ fair ROM; no JVD; normal carotid impulses w/o bruits; no thyromegaly or nodules palpated; no lymphadenopathy. CHEST:  Clear to P & A; without wheezes/ rales/ or rhonchi. HEART:  Regular Rhythm; without murmurs/ rubs/ or gallops. ABD:  obese, soft, non-tender;  no organomegaly or masses palpated... EXT: without  deformities, mod arthritic changes; no varicose veins/ +venous insuffic/ no edema. NEURO: pleasantly confused DERM:  No lesions noted; no rash etc...  RADIOLOGY DATA:  Reviewed in the EPIC EMR & discussed w/ the patient...  LABORATORY DATA:  Reviewed in the EPIC EMR & discussed w/ the patient...   Assessment:      HBP>  Controlled on Labetolol & Amlodipine;  Continue same...  CAD>  She denies CP, palpit, SOB;  Family confirms stable w/o cardiac symptoms...  CHOL>  On diet alone;  They are un able to bring her into our lab for fasting blood work...  DM>  On Glucovance 2.5/500 which daugh administers as a SS based on BS;  With A1c at 5.6 she is a little tight & asked to lighten up a bit...  GI> HH, Divertics, IBS>  There are no GI symptoms and family confirms adeq intake, output, etc...  Renal Insuffic>  Renal function is normal attesting to good care by daughter, well hydrated, etc...  DJD, LBP>  She does not complain anymore but if she appears to be in pain daugh will Rx w/ ASA vs Tylenol...  NEURO> Senile dementia, Hx TIA>  On Aricept & Namenda; doubt much benefit at this point & asked daugh to assess need/ benefit to continuing...  Hx Anemia>  Hg is improved to 12.2, no recurrent vag bleeding etc...     Plan:     Patient's Medications  New Prescriptions   No medications on file  Previous Medications   AMLODIPINE (  NORVASC) 10 MG TABLET    TAKE 1/2 TABLET BY MOUTH ONCE DAILY   CHOLECALCIFEROL (VITAMIN D) 2000 UNITS CAPS    Take 1 capsule by mouth daily.   DONEPEZIL (ARICEPT) 10 MG TABLET    TAKE 1 TABLET BY MOUTH ONCE A DAY   GLYBURIDE-METFORMIN (GLUCOVANCE) 2.5-500 MG PER TABLET    TAKE 1/2 TABLET BY MOUTH in the mornings   LABETALOL (NORMODYNE) 200 MG TABLET    TAKE 1 TABLET BY MOUTH TWICE A DAY   MULTIPLE VITAMINS-MINERALS (WOMENS MULTIVITAMIN PLUS PO)    Take 1 tablet by mouth daily.   NAMENDA 10 MG TABLET    TAKE 1 TABLET BY MOUTH TWICE DAILY.   TRAMADOL (ULTRAM) 50  MG TABLET    TAKE 1 TABLET 4 TIMES A DAY AS NEEDED FOR ARTHRITIS PAIN  Modified Medications   Modified Medication Previous Medication   KLOR-CON 10 10 MEQ TABLET potassium chloride (K-DUR) 10 MEQ tablet      TAKE 2 TABLETS BY MOUTH EVERY DAY    Take 2 tablets by mouth daily  Discontinued Medications   No medications on file

## 2013-12-05 ENCOUNTER — Telehealth: Payer: Self-pay | Admitting: Pulmonary Disease

## 2013-12-05 MED ORDER — MEMANTINE HCL 10 MG PO TABS: ORAL_TABLET | ORAL | Status: DC

## 2013-12-05 NOTE — Telephone Encounter (Signed)
Dr Kriste BasqueNadel please advise if okay to refill Namenda [facility] calling for refill Last OV 11/04/13 Last filled 04/15/13 x 6 RF

## 2013-12-05 NOTE — Telephone Encounter (Signed)
Namenda 10mg  #60 Rx sent to [facility] [address]  lmtcb x 1 for pt to make aware

## 2013-12-05 NOTE — Telephone Encounter (Signed)
Per SN---  Ok to refill the namenda.  thanks

## 2013-12-08 NOTE — Telephone Encounter (Signed)
Pt aware. Sonya Price, CMA  

## 2014-01-01 ENCOUNTER — Other Ambulatory Visit: Payer: Self-pay | Admitting: Pulmonary Disease

## 2014-01-13 ENCOUNTER — Telehealth: Payer: Self-pay | Admitting: Pulmonary Disease

## 2014-01-13 MED ORDER — AZITHROMYCIN 250 MG PO TABS: ORAL_TABLET | ORAL | Status: DC

## 2014-01-13 MED ORDER — PREDNISONE (PAK) 5 MG PO TABS: ORAL_TABLET | ORAL | Status: DC

## 2014-01-13 NOTE — Telephone Encounter (Signed)
Per SN---  zpak #1  Take as directed pred dosepak 5 mg  6 day pack otc mucinex 1-2 BID  Nasal saline prn

## 2014-01-13 NOTE — Telephone Encounter (Signed)
Called spoke with [name]. Aware of recs RX called in. Nothing further needed

## 2014-01-13 NOTE — Telephone Encounter (Signed)
Spoke with [name]. She reports pt is having a lot of chest congestion, prod cough (? Color), slight wheezing, PND, nasal congestion x weekend.. No chest tx, no increase SOB. Taking robitussin. Please advise SN thanks. Allergies  Allergen Reactions  . Atorvastatin     REACTION: increased myalgia/CPK  . Pioglitazone     REACTION: swelling     Current Outpatient Prescriptions on File Prior to Visit  Medication Sig Dispense Refill  . amLODipine (NORVASC) 10 MG tablet TAKE 1/2 TABLET BY MOUTH ONCE DAILY  30 tablet  2  . Cholecalciferol (VITAMIN D) 2000 UNITS CAPS Take 1 capsule by mouth daily.      [name] donepezil (ARICEPT) 10 MG tablet TAKE 1 TABLET BY MOUTH ONCE A DAY  30 tablet  7  . donepezil (ARICEPT) 10 MG tablet TAKE 1 TABLET BY MOUTH ONCE A DAY  30 tablet  6  . glyBURIDE-metformin (GLUCOVANCE) 2.5-500 MG per tablet TAKE 1/2 TABLET BY MOUTH in the mornings      . KLOR-CON 10 10 MEQ tablet TAKE 2 TABLETS BY MOUTH EVERY DAY  180 tablet  3  . labetalol (NORMODYNE) 200 MG tablet TAKE 1 TABLET BY MOUTH TWICE A DAY  60 tablet  5  . memantine (NAMENDA) 10 MG tablet TAKE 1 TABLET BY MOUTH TWICE DAILY.  60 tablet  6  . Multiple Vitamins-Minerals (WOMENS MULTIVITAMIN PLUS PO) Take 1 tablet by mouth daily.      . traMADol (ULTRAM) 50 MG tablet TAKE 1 TABLET 4 TIMES A DAY AS NEEDED FOR ARTHRITIS PAIN  100 tablet  5   No current facility-administered medications on file prior to visit.

## 2014-02-20 ENCOUNTER — Ambulatory Visit (INDEPENDENT_AMBULATORY_CARE_PROVIDER_SITE_OTHER): Payer: Self-pay

## 2014-02-20 DIAGNOSIS — Z23 Encounter for immunization: Secondary | ICD-10-CM

## 2014-02-23 ENCOUNTER — Encounter: Payer: Self-pay | Admitting: Pulmonary Disease

## 2014-02-26 ENCOUNTER — Ambulatory Visit (INDEPENDENT_AMBULATORY_CARE_PROVIDER_SITE_OTHER): Payer: PRIVATE HEALTH INSURANCE

## 2014-02-26 DIAGNOSIS — Z23 Encounter for immunization: Secondary | ICD-10-CM

## 2014-03-03 ENCOUNTER — Other Ambulatory Visit: Payer: Self-pay | Admitting: Pulmonary Disease

## 2014-04-06 ENCOUNTER — Telehealth: Payer: Self-pay | Admitting: Pulmonary Disease

## 2014-04-06 NOTE — Telephone Encounter (Signed)
Called and spoke with [place]---pts daughter and she is aware of appt that has been scheduled for Wednesday morning.  Nothing further is needed.

## 2014-04-06 NOTE — Telephone Encounter (Signed)
[facility] they were not told pt needed to find a PCP and so therefore did not try find one. She wants to schedule pt an appt to see SN. Please advise thanks

## 2014-04-08 ENCOUNTER — Ambulatory Visit (INDEPENDENT_AMBULATORY_CARE_PROVIDER_SITE_OTHER): Payer: PRIVATE HEALTH INSURANCE | Admitting: Pulmonary Disease

## 2014-04-08 ENCOUNTER — Encounter: Payer: Self-pay | Admitting: Pulmonary Disease

## 2014-04-08 ENCOUNTER — Other Ambulatory Visit (INDEPENDENT_AMBULATORY_CARE_PROVIDER_SITE_OTHER): Payer: PRIVATE HEALTH INSURANCE

## 2014-04-08 ENCOUNTER — Encounter (INDEPENDENT_AMBULATORY_CARE_PROVIDER_SITE_OTHER): Payer: Self-pay

## 2014-04-08 VITALS — BP 114/60 | HR 66 | Temp 98.4°F

## 2014-04-08 DIAGNOSIS — I10 Essential (primary) hypertension: Secondary | ICD-10-CM

## 2014-04-08 DIAGNOSIS — I2510 Atherosclerotic heart disease of native coronary artery without angina pectoris: Secondary | ICD-10-CM

## 2014-04-08 DIAGNOSIS — K5730 Diverticulosis of large intestine without perforation or abscess without bleeding: Secondary | ICD-10-CM

## 2014-04-08 DIAGNOSIS — E118 Type 2 diabetes mellitus with unspecified complications: Secondary | ICD-10-CM

## 2014-04-08 DIAGNOSIS — G459 Transient cerebral ischemic attack, unspecified: Secondary | ICD-10-CM

## 2014-04-08 DIAGNOSIS — E7800 Pure hypercholesterolemia, unspecified: Secondary | ICD-10-CM

## 2014-04-08 DIAGNOSIS — F0390 Unspecified dementia without behavioral disturbance: Secondary | ICD-10-CM

## 2014-04-08 DIAGNOSIS — N259 Disorder resulting from impaired renal tubular function, unspecified: Secondary | ICD-10-CM

## 2014-04-08 DIAGNOSIS — K449 Diaphragmatic hernia without obstruction or gangrene: Secondary | ICD-10-CM

## 2014-04-08 LAB — BASIC METABOLIC PANEL
BUN: 14 mg/dL (ref 6–23)
CHLORIDE: 111 meq/L (ref 96–112)
CO2: 22 mEq/L (ref 19–32)
Calcium: 9.2 mg/dL (ref 8.4–10.5)
Creatinine, Ser: 0.7 mg/dL (ref 0.4–1.2)
GFR: 105.04 mL/min (ref >60.00)
Glucose, Bld: 108 mg/dL — ABNORMAL HIGH (ref 70–99)
Potassium: 4.1 mEq/L (ref 3.5–5.1)
SODIUM: 142 meq/L (ref 135–145)

## 2014-04-08 LAB — HEMOGLOBIN A1C: HEMOGLOBIN A1C: 6 % (ref 4.6–6.5)

## 2014-04-08 MED ORDER — TRAMADOL HCL 50 MG PO TABS: ORAL_TABLET | ORAL | Status: DC

## 2014-04-08 NOTE — Progress Notes (Signed)
Subjective:     Patient ID: Sonya Price, female   DOB: 1924/05/17, 80 y.o.   MRN: [id number]  HPI 80 y/o BF here for a follow up visit... she has mult med problems as noted below...   ~  May 08, 2011:  76mo ROV & they report episode of vag bleeding, seen at [facility] ER & blood clot seen in vag vault, eval was otherw neg, nothing found & the symptom resolved;  LABS showed Hg=12.4;  Sonars were neg; they decided on watchful waiting & all symptoms resolved w/o recurrence...    Severe Alzheimer's> pt won't look at me, won't talk to me, daughter says intermittently responsive, remains on Aricept & Namenda...    HBP, CAD> on Labet200Bid, Amlod10; BP= 110/68 & she has no complaints, daugh confirms clinically stable...    Chol> on diet alone & last FLP was yrs ago as they are unable to get her into office fasting; not interested in med rx given her comorbidities...    DM> on Glucovance 2.5/500 that daught er administers based on SS> 0- 1/2- 1 depending on her sugars; random BS today= 119, A1c=5.5; I told her control was tight & she could loosen it up a tad...    GI- HH, Divertics, IBS> stable, needs help w/ toileting, eating, etc...    DJD, LBP> she takes ASA as needed for pain but really doesn't complain much at all...  ~  November 04, 2012:  61mo ROV & brought in by daughter for check> pt is poorly responsive w/ severe dementia, virtually mute w/ occas vocalizations, daugh notes intertrig rash (Rx Lotrisone cream prn), pt has to be fed, keep eyes closed much of the time, but no complaints!    Severe Alzheimer's> pt won't look at me, won't talk to me, daughter says intermittently responsive, remains on Aricept & Namenda; DNR & MOST form completed.    HBP, CAD> on Labet200Bid, Amlod10; BP= 140/74 & she has no complaints, daugh confirms clinically stable...    Chol> on diet alone & last FLP was yrs ago as they are unable to get her into office fasting; not interested in med rx given her comorbidities...    DM> on Glucovance 2.5/500 that daughter administers based on SS> 0- 1/2- 1 depending on her sugars; random BS today= 142, A1c=5.7; I told her control was still tight & she could loosen it up a little...    GI- HH, Divertics, IBS> stable, needs help w/ toileting, eating, etc...    DJD, LBP> she takes ASA as needed for pain but really doesn't complain much at all... We reviewed prob list, meds, xrays and labs> see below for updates >>   LABS 7/14:  Chems- ok x K=3.2 & we added K20/d, BS=142 A1c=5.7 on Glucov 1/2 tab prn only;  CBC- ok w/ Hg=12.1;  TSH=1.78;  VitD=24 & rc to add MVI + 2000uVitD...  ~  June 12, 2013:  42mo ROV & daughter brought her in for face-to-face so she can get a Hoyer-Lift to aid in her home care needs... She notes that mom can't stand, can't walk, can't sit up on her own, drools, has to be fed, incontinent & wears depends, etc; she is a total care pt & considered permanently & totally disabled;  She is a NCB, comfort care only...     Severe Alzheimer's> pt won't look at me, won't talk to me, daughter says intermittently responsive, remains on Aricept10 & Namenda10Bid; DNR & MOST form prev completed.  HBP, CAD> on Labet200Bid, Amlod11/2tab, K10-2/d; BP= 130/72 & she has no complaints, daugh confirms clinically stable...    Chol> on diet alone & last FLP was yrs ago as they are unable to get her into office fasting; not interested in med rx given her comorbidities...    DM> on Glucovance 2.5/500 that daughter administers based on SS> 0- 1/2- 1 depending on her sugars; random BS today= 146, A1c=5.6; I told her control was still tight & she could loosen up the SS a little...    GI- HH, Divertics, IBS> stable, needs help w/ toileting, eating, etc...    DJD, LBP> she takes ASA as needed for pain but really doesn't complain much at all... We reviewed prob list, meds, xrays and labs> see below for updates >> Rx for Depends, chux pads, OK Hoyer lift...  LABS 2/15:  Chems- ok w/  BS=146, A1c=5.6, BUN=14, Cr=0.7, K=3.8.Marland KitchenMarland Kitchen  ~  November 04, 2013:  67mo ROV & Sonya Price is stable, cared for very well by her daughter; Sonya Price has very severe end-stage Alz dis & is virtually non-communicative, total care patient... The hoyer lift has really helped w/ her care at home...     Severe Alzheimer's> pt won't look at me, won't talk to me, daughter says intermittently responsive, remains on Aricept10 & Namenda10Bid; DNR & MOST form prev completed.    HBP, CAD> on Labet200Bid, Amlod5, K10-2/d; BP= 120/70 & she has no complaints, daugh confirms clinically stable...    Chol> on diet alone & last FLP was yrs ago as they are unable to get her into office fasting; not interested in med rx given her comorbidities...    DM> on Glucovance 2.5/500 that daughter administers based on SS> 0- 1/2- 1 depending on her sugars; Labs today showed BS=218, A1c=5.6    GI- HH, Divertics, IBS> stable, needs help w/ toileting, eating, etc...    DJD, LBP> she takes ASA as needed for pain but really doesn't complain much at all... We reviewed prob list, meds, xrays and labs> see below for updates >>   LABS 7/15:  Chems- ok x BS=218, A1c=5.6, Alb=3.3;  CBC- wnl w/ Hg=12.2;  TSH=1.60...  ~  April 08, 2014:  67mo ROV & daughter Sonya Price continues to do total care for Sonya Price> she reports stable medically, notes pt will occas interact w/ her, occas cooperate to help w/ her care needs/ exercise/ etc; otherw she remains a total care pt, they do passive ROM exercises mostly, & they use the hoyer lift to get her up...     She has a very severe end-stage senile dementia w/ MMSE 0/30; stable on Aricept10 & Namenda10Bid, they wish to continue same & not switch to Namzeric at this time...    BP is regulated w/ Labetolol200Bid, Amlodipine10, K10- 2daily; BP= 114/60 today...    She remains on Glucovance 2.5/500- taking 1/2 tab Qam; Labs today showed BS= 108, A1c= 6.0; continue same...    She also continues on VitD2000 & Tramadol50 prn... We  reviewed prob list, meds, xrays and labs> see below for updates >>   LABS 12/15:  Chems- ok w/ BS=108, A1c=6.0.Marland KitchenMarland Kitchen           Problem List:   STRABISMUS - she has a remarkable strabismus, but vision seems adeq per family hx & given her severe underlying dementia they have decided not to pursue this prob w/ ophthalmology.  HYPERTENSION (ICD-401.9) - on LABETOLOL 200mg Bid, NORVASC 10mg /d, off Lasix & KCl... ~  CXR 12/09 showed mild  cardiomeg, tort Ao, clear lungs x basilar atx... ~  10/11: BP= 140/80 & tolerates meds well... denies HA, fatigue, visual changes, CP, palipit, dizziness, syncope, dyspnea, edema, etc... but as noted she has dementia and denies all symptoms. ~  1/13:  BP= 110/68 & she remains asymptomatic 7 doing satis according to family... ~  7/14: on Labet200Bid, Amlod10; BP= 140/74 & she has no complaints, daugh confirms clinically stable ~  2/15: on Labet200Bid, Amlod11/2tab, K10-2/d; BP= 130/72 & she has no complaints, daugh confirms clinically stable ~  7/15: on Labet200Bid, Amlod5, K10-2/d; BP= 120/70 & she has no complaints, daugh confirms clinically stable ~  12/15: on Labet200Bid, Amlod5, K10-2/d; BP= 114/60  CAD (ICD-414.00) ~  Persantine Cardiolite 3/98 was norm w/o ischemia or infarct, EF=72%... ~  cath 12/99 showed Ao root dil, 2 vessel non-obstructive CAD w/ 20-30% lesions, EF=70%...  HYPERCHOLESTEROLEMIA (ICD-272.0) - prev on Zetia, now on diet alone...  family unable to bring her into the office for FASTING blood work.  DM (ICD-250.00) - on GLUCOVANCE 2.5-500 by "sliding scale" per daughter> ie 0- 1/2- 1 depending on her sugars... ~  labs 3/09 showed BS= 96, HgA1c= 6.0. ~  labs 12/09 in hosp showed BS= 122, HgA1c= 6.2. ~  labs 10/11 showed BS= 106, A1c= 5.8.[name].[name]. this is prob too tight control & rec to decr Glucovance to 1/2 tab Qam only. ~  Labs 1/13 showed BS= 119, A1c= 5.5.[name].. daugh uses her sliding scal before giving her med... ~  7/14: on Glucovance 2.5/500  that daughter administers based on SS> 0- 1/2- 1 depending on her sugars; random BS today= 142, A1c=5.7; I told her control was still tight & she could loosen it up a little. ~  2/15: on Glucovance 2.5/500 that daughter administers based on SS> 0- 1/2- 1 depending on her sugars; random BS today= 146, A1c=5.6 ~  7/15: on Glucovance 2.5/500 that daughter administers based on SS> 0- 1/2- 1 depending on her sugars; Labs today showed BS=218, A1c=5.6 ~  12/15: on Glucov2.5/500-1/2daily w/ BS=108, A1c=6.0; rec to continue same...  HIATAL HERNIA (ICD-553.3) - no longer taking her PPI therapy- prev Nexium Rx... ~  last EGD was 11/96 by DrBrodie showing deformed pylorus c/w chr PUD, some gastric retension. ~  Ba swallow 11/08 was essent neg- sm HH, no reflux...  DIVERTICULOSIS OF COLON (ICD-562.10) - prev Rx w/ Levsin, Metamucil... ~  last colonoscopy was 2/04 by DrBrodie showing divertics, no polyps...   IRRITABLE BOWEL SYNDROME (ICD-564.1)  Hx of RENAL INSUFFICIENCY (ICD-588.9) ~  labs 3/09 showed BUN= 15, Creat= 0.8 ~  labs 12/09 showed BUN= 23, Creat= 0.88 ~  labs 10/11 showed BUN= 19, Creat= 0.8 ~  Labs 1/13 showed BUN= 17, Creat= 0.7 ~  Labs 7/14 showed BUN= 15, Cr= 0.7 ~  Labs 2/15 showed BUN= 14, Cr= 0.7 ~  Labs 7/15 showed BUN= 13, Cr= 0.6  DEGENERATIVE JOINT DISEASE (ICD-715.90) - on TRAMADOL Prn...  BACK PAIN, LUMBAR (ICD-724.2)  VITAMIN D DEFICIENCY >>  ~  Labs 7/14 showed VitD level = 24 & they are rec to take Women's formula MVI & Vit D supplement ~2000u daily...  TIA (ICD-435.9) - on ASA 81mg /d...  ~  CT Brain 12/09 showed atherosclerotic changes, atrophy, NAD.[name].. ~ 7/14:  Daughter denies any cerebral ischemic symptoms...  Hx of HEADACHE (ICD-784.0) -  prev Rx w/ on Neurontin 300Qhs... eval by DrWillis 8/08...   SENILE DEMENTIA (ICD-290.0) - on ARICEPT 10mg /d & NAMENDA 10mg Bid... family cares for pt  at home and they do a beautiful job... they have to put meds in her food  otherw she won't take them... ~  CT Brain 12/09 showed atrophy & sm vessel dis, no acute process... ~  Clinically she has progressive severe senile dementia & she is a total care pt...  Hx of BELL'S PALSY, RIGHT (ICD-351.0) - ** see above ** Hosp overnight 04/21/08 w/ right facial weakness c/w Bell's Palsey... Rx w/ Acyclovir and improved...  SHINGLES, HX OF (ICD-V13.8)  Hx of ANEMIA (ICD-285.9) ~  Labs 4/11 showed Hg= 11.8, MCV= 92 ~  Labs 1/13 showed Hg= 12.5, MCV= 92 ~  Labs 7/14 showed Hg= 12.1 ~  Labs 7/15 showed Hg= 12.2   Past Surgical History  Procedure Laterality Date  . Cholecystectomy    . Hiatal hernia repair      Outpatient Encounter Prescriptions as of 04/08/2014  Medication Sig  . amLODipine (NORVASC) 10 MG tablet Take 10 mg by mouth daily.  . Cholecalciferol (VITAMIN D) 2000 UNITS CAPS Take 1 capsule by mouth daily.  [name]. donepezil (ARICEPT) 10 MG tablet TAKE 1 TABLET BY MOUTH ONCE A DAY  . glyBURIDE-metformin (GLUCOVANCE) 2.5-500 MG per tablet TAKE 1/2 TABLET BY MOUTH in the mornings  . KLOR-CON 10 10 MEQ tablet TAKE 2 TABLETS BY MOUTH EVERY DAY  . labetalol (NORMODYNE) 200 MG tablet TAKE 1 TABLET BY MOUTH TWICE A DAY  . Multiple Vitamins-Minerals (WOMENS MULTIVITAMIN PLUS PO) Take 1 tablet by mouth daily.  . traMADol (ULTRAM) 50 MG tablet TAKE 1 TABLET three times  A DAY AS NEEDED FOR ARTHRITIS PAIN  . [DISCONTINUED] memantine (NAMENDA) 10 MG tablet TAKE 1 TABLET BY MOUTH TWICE DAILY.  . [DISCONTINUED] traMADol (ULTRAM) 50 MG tablet TAKE 1 TABLET 4 TIMES A DAY AS NEEDED FOR ARTHRITIS PAIN  . [DISCONTINUED] amLODipine (NORVASC) 10 MG tablet TAKE 1/2 TABLET BY MOUTH ONCE DAILY (Patient not taking: Reported on 04/08/2014)  . [DISCONTINUED] azithromycin (ZITHROMAX) 250 MG tablet Take as directed (Patient not taking: Reported on 04/08/2014)  . [DISCONTINUED] donepezil (ARICEPT) 10 MG tablet TAKE 1 TABLET BY MOUTH ONCE A DAY (Patient not taking: Reported on 04/08/2014)  .  [DISCONTINUED] predniSONE (STERAPRED UNI-PAK) 5 MG TABS tablet 6 day pack take as directed (Patient not taking: Reported on 04/08/2014)    Allergies  Allergen Reactions  . Atorvastatin     REACTION: increased myalgia/CPK  . Pioglitazone     REACTION: swelling    Current Medications, Allergies, Past Medical History, Past Surgical History, Family History, and Social History were reviewed in [facility] electronic medical record.   Review of Systems        See HPI - all other systems neg except as noted... The patient complains of decreased hearing, dyspnea on exertion, muscle weakness, and difficulty walking.  The patient denies anorexia, fever, weight loss, weight gain, vision loss, hoarseness, chest pain, syncope, peripheral edema, prolonged cough, headaches, hemoptysis, abdominal pain, melena, hematochezia, severe indigestion/heartburn, hematuria, incontinence, suspicious skin lesions, transient blindness, depression, unusual weight change, abnormal bleeding, enlarged lymph nodes, and angioedema.    Objective:   Physical Exam     WD, Overweight, 80 y/o BF in NAD... GENERAL:  Lethargic & poorly responsive- she has an obvious dementia... HEENT:  [place]/AT, marked strabismus, EACs-cerumne bilaterlly,  NOSE-clear, THROAT-clear & wnl. Min residual right facial weakness seen... NECK:  Supple w/ fair ROM; no JVD; normal carotid impulses w/o bruits; no thyromegaly or nodules palpated; no lymphadenopathy. CHEST:  Clear to P &  A; without wheezes/ rales/ or rhonchi. HEART:  Regular Rhythm; without murmurs/ rubs/ or gallops. ABD:  obese, soft, non-tender;  no organomegaly or masses palpated... EXT: without deformities, mod arthritic changes; no varicose veins/ +venous insuffic/ no edema. NEURO: pleasantly confused DERM:  No lesions noted; no rash etc...  RADIOLOGY DATA:  Reviewed in the EPIC EMR & discussed w/ the patient...  LABORATORY DATA:  Reviewed in the EPIC EMR & discussed w/ the  patient...   Assessment:      HBP>  Controlled on Labetolol & Amlodipine;  Continue same...  CAD>  She denies CP, palpit, SOB;  Family confirms stable w/o cardiac symptoms...  CHOL>  On diet alone;  They are un able to bring her into our lab for fasting blood work...  DM>  On Glucovance 2.5/500 which daugh administers as a SS based on BS;  With A1c at 6.0 she is stable on current regimen...  GI> HH, Divertics, IBS>  There are no GI symptoms and family confirms adeq intake, output, etc...  Renal Insuffic>  Renal function is normal attesting to good care by daughter, well hydrated, etc...  DJD, LBP>  She does not complain anymore but if she appears to be in pain daugh will Rx w/ ASA vs Tylenol...  NEURO> Senile dementia, Hx TIA>  On Aricept & Namenda; doubt much benefit at this point & asked daugh to assess need/ benefit to continuing...  Hx Anemia>  Hg is improved to 12.2, no recurrent vag bleeding etc...     Plan:     Patient's Medications  New Prescriptions   No medications on file  Previous Medications   AMLODIPINE (NORVASC) 10 MG TABLET    Take 10 mg by mouth daily.   CHOLECALCIFEROL (VITAMIN D) 2000 UNITS CAPS    Take 1 capsule by mouth daily.   DONEPEZIL (ARICEPT) 10 MG TABLET    TAKE 1 TABLET BY MOUTH ONCE A DAY   GLYBURIDE-METFORMIN (GLUCOVANCE) 2.5-500 MG PER TABLET    TAKE 1/2 TABLET BY MOUTH in the mornings   KLOR-CON 10 10 MEQ TABLET    TAKE 2 TABLETS BY MOUTH EVERY DAY   LABETALOL (NORMODYNE) 200 MG TABLET    TAKE 1 TABLET BY MOUTH TWICE A DAY   MULTIPLE VITAMINS-MINERALS (WOMENS MULTIVITAMIN PLUS PO)    Take 1 tablet by mouth daily.  Modified Medications   Modified Medication Previous Medication   TRAMADOL (ULTRAM) 50 MG TABLET traMADol (ULTRAM) 50 MG tablet      TAKE 1 TABLET three times  A DAY AS NEEDED FOR ARTHRITIS PAIN    TAKE 1 TABLET 4 TIMES A DAY AS NEEDED FOR ARTHRITIS PAIN  Discontinued Medications   AMLODIPINE (NORVASC) 10 MG TABLET    TAKE 1/2  TABLET BY MOUTH ONCE DAILY   AZITHROMYCIN (ZITHROMAX) 250 MG TABLET    Take as directed   DONEPEZIL (ARICEPT) 10 MG TABLET    TAKE 1 TABLET BY MOUTH ONCE A DAY   MEMANTINE (NAMENDA) 10 MG TABLET    TAKE 1 TABLET BY MOUTH TWICE DAILY.   PREDNISONE (STERAPRED UNI-PAK) 5 MG TABS TABLET    6 day pack take as directed

## 2014-04-08 NOTE — Patient Instructions (Signed)
Today we updated your med list in our EPIC system...    Continue your current medications the same...  Today we did your follow up DM labs...    We will contact you w/ the results when available...   Keep up the great job of home care!!!  Call for any questions...  Let's plan a follow up visit in 48mo, sooner if needed for problems.[name]..[name]

## 2014-06-28 ENCOUNTER — Other Ambulatory Visit: Payer: Self-pay | Admitting: Pulmonary Disease

## 2014-07-08 ENCOUNTER — Telehealth: Payer: Self-pay | Admitting: Pulmonary Disease

## 2014-07-08 DIAGNOSIS — F0390 Unspecified dementia without behavioral disturbance: Secondary | ICD-10-CM

## 2014-07-08 DIAGNOSIS — M545 Low back pain: Secondary | ICD-10-CM

## 2014-07-08 NOTE — Telephone Encounter (Signed)
Order has been placed. Pt's daughter is aware. Nothing further was needed.

## 2014-08-06 ENCOUNTER — Other Ambulatory Visit: Payer: Self-pay | Admitting: Pulmonary Disease

## 2014-08-22 ENCOUNTER — Other Ambulatory Visit: Payer: Self-pay | Admitting: Pulmonary Disease

## 2014-10-08 ENCOUNTER — Encounter: Payer: Self-pay | Admitting: Pulmonary Disease

## 2014-10-08 ENCOUNTER — Ambulatory Visit (INDEPENDENT_AMBULATORY_CARE_PROVIDER_SITE_OTHER): Payer: Medicare Other | Admitting: Pulmonary Disease

## 2014-10-08 VITALS — BP 116/70 | HR 68

## 2014-10-08 DIAGNOSIS — E7800 Pure hypercholesterolemia, unspecified: Secondary | ICD-10-CM

## 2014-10-08 DIAGNOSIS — K5730 Diverticulosis of large intestine without perforation or abscess without bleeding: Secondary | ICD-10-CM

## 2014-10-08 DIAGNOSIS — I10 Essential (primary) hypertension: Secondary | ICD-10-CM | POA: Diagnosis not present

## 2014-10-08 DIAGNOSIS — N259 Disorder resulting from impaired renal tubular function, unspecified: Secondary | ICD-10-CM

## 2014-10-08 DIAGNOSIS — E118 Type 2 diabetes mellitus with unspecified complications: Secondary | ICD-10-CM

## 2014-10-08 DIAGNOSIS — G459 Transient cerebral ischemic attack, unspecified: Secondary | ICD-10-CM

## 2014-10-08 DIAGNOSIS — I2510 Atherosclerotic heart disease of native coronary artery without angina pectoris: Secondary | ICD-10-CM | POA: Diagnosis not present

## 2014-10-08 DIAGNOSIS — F0390 Unspecified dementia without behavioral disturbance: Secondary | ICD-10-CM

## 2014-10-08 DIAGNOSIS — K440 Diaphragmatic hernia with obstruction, without gangrene: Secondary | ICD-10-CM

## 2014-10-08 MED ORDER — TRAMADOL HCL 50 MG PO TABS: ORAL_TABLET | ORAL | Status: DC

## 2014-10-08 NOTE — Progress Notes (Signed)
Subjective:     Patient ID: Sonya Price, female   DOB: 03-10-1925, 81 y.o.   MRN: [id number]  HPI 81 y/o BF here for a follow up visit... she has mult med problems as noted below...   ~  May 08, 2011:  71mo ROV & they report episode of vag bleeding, seen at [facility] ER & blood clot seen in vag vault, eval was otherw neg, nothing found & the symptom resolved;  LABS showed Hg=12.4;  Sonars were neg; they decided on watchful waiting & all symptoms resolved w/o recurrence...    Severe Alzheimer's> pt won't look at me, won't talk to me, daughter says intermittently responsive, remains on Aricept & Namenda...    HBP, CAD> on Labet200Bid, Amlod10; BP= 110/68 & she has no complaints, daugh confirms clinically stable...    Chol> on diet alone & last FLP was yrs ago as they are unable to get her into office fasting; not interested in med rx given her comorbidities...    DM> on Glucovance 2.5/500 that daught er administers based on SS> 0- 1/2- 1 depending on her sugars; random BS today= 119, A1c=5.5; I told her control was tight & she could loosen it up a tad...    GI- HH, Divertics, IBS> stable, needs help w/ toileting, eating, etc...    DJD, LBP> she takes ASA as needed for pain but really doesn't complain much at all...  ~  November 04, 2012:  53mo ROV & brought in by daughter for check> pt is poorly responsive w/ severe dementia, virtually mute w/ occas vocalizations, daugh notes intertrig rash (Rx Lotrisone cream prn), pt has to be fed, keep eyes closed much of the time, but no complaints!    Severe Alzheimer's> pt won't look at me, won't talk to me, daughter says intermittently responsive, remains on Aricept & Namenda; DNR & MOST form completed.    HBP, CAD> on Labet200Bid, Amlod10; BP= 140/74 & she has no complaints, daugh confirms clinically stable...    Chol> on diet alone & last FLP was yrs ago as they are unable to get her into office fasting; not interested in med rx given her comorbidities...     DM> on Glucovance 2.5/500 that daughter administers based on SS> 0- 1/2- 1 depending on her sugars; random BS today= 142, A1c=5.7; I told her control was still tight & she could loosen it up a little...    GI- HH, Divertics, IBS> stable, needs help w/ toileting, eating, etc...    DJD, LBP> she takes ASA as needed for pain but really doesn't complain much at all... We reviewed prob list, meds, xrays and labs> see below for updates >>   LABS 7/14:  Chems- ok x K=3.2 & we added K20/d, BS=142 A1c=5.7 on Glucov 1/2 tab prn only;  CBC- ok w/ Hg=12.1;  TSH=1.78;  VitD=24 & rc to add MVI + 2000uVitD...  ~  June 12, 2013:  25mo ROV & daughter brought her in for face-to-face so she can get a Hoyer-Lift to aid in her home care needs... She notes that mom can't stand, can't walk, can't sit up on her own, drools, has to be fed, incontinent & wears depends, etc; she is a total care pt & considered permanently & totally disabled;  She is a NCB, comfort care only...     Severe Alzheimer's> pt won't look at me, won't talk to me, daughter says intermittently responsive, remains on Aricept10 & Namenda10Bid; DNR & MOST form prev completed.  HBP, CAD> on Labet200Bid, Amlod11/2tab, K10-2/d; BP= 130/72 & she has no complaints, daugh confirms clinically stable...    Chol> on diet alone & last FLP was yrs ago as they are unable to get her into office fasting; not interested in med rx given her comorbidities...    DM> on Glucovance 2.5/500 that daughter administers based on SS> 0- 1/2- 1 depending on her sugars; random BS today= 146, A1c=5.6; I told her control was still tight & she could loosen up the SS a little...    GI- HH, Divertics, IBS> stable, needs help w/ toileting, eating, etc...    DJD, LBP> she takes ASA as needed for pain but really doesn't complain much at all... We reviewed prob list, meds, xrays and labs> see below for updates >> Rx for Depends, chux pads, OK Hoyer lift...  LABS 2/15:  Chems- ok w/  BS=146, A1c=5.6, BUN=14, Cr=0.7, K=3.8.Marland KitchenMarland Kitchen  ~  November 04, 2013:  14mo ROV & Sonya Price is stable, cared for very well by her daughter; Sonya Price has very severe end-stage Alz dis & is virtually non-communicative, total care patient... The hoyer lift has really helped w/ her care at home...     Severe Alzheimer's> pt won't look at me, won't talk to me, daughter says intermittently responsive, remains on Aricept10 & Namenda10Bid; DNR & MOST form prev completed.    HBP, CAD> on Labet200Bid, Amlod5, K10-2/d; BP= 120/70 & she has no complaints, daugh confirms clinically stable...    Chol> on diet alone & last FLP was yrs ago as they are unable to get her into office fasting; not interested in med rx given her comorbidities...    DM> on Glucovance 2.5/500 that daughter administers based on SS> 0- 1/2- 1 depending on her sugars; Labs today showed BS=218, A1c=5.6    GI- HH, Divertics, IBS> stable, needs help w/ toileting, eating, etc...    DJD, LBP> she takes ASA as needed for pain but really doesn't complain much at all... We reviewed prob list, meds, xrays and labs> see below for updates >>   LABS 7/15:  Chems- ok x BS=218, A1c=5.6, Alb=3.3;  CBC- wnl w/ Hg=12.2;  TSH=1.60...  ~  April 08, 2014:  14mo ROV & daughter Sonya Price continues to do total care for Sonya Price> she reports stable medically, notes pt will occas interact w/ her, occas cooperate to help w/ her care needs/ exercise/ etc; otherw she remains a total care pt, they do passive ROM exercises mostly, & they use the hoyer lift to get her up...     She has a very severe end-stage senile dementia w/ MMSE 0/30; stable on Aricept10 & Namenda10Bid, they wish to continue same & not switch to Namzeric at this time...    BP is regulated w/ Labetolol200Bid, Amlodipine10, K10- 2daily; BP= 114/60 today...    She remains on Glucovance 2.5/500- taking 1/2 tab Qam; Labs today showed BS= 108, A1c= 6.0; continue same...    She also continues on VitD2000 & Tramadol50 prn... We  reviewed prob list, meds, xrays and labs> see below for updates >>   LABS 12/15:  Chems- ok w/ BS=108, A1c=6.0.Marland KitchenMarland Kitchen  ~  October 08, 2014:  39mo ROV & Sonya Price brings Momma back for a follow up visit- she remains a total care pt for daughter who continues to do a fabulous job w/ her care; Burkley has severe end-stage dementia (mute, unresponsive, incapable of participating in her own care);  Medically she has been stable w/o resp or CV symptoms etc..[name]  We reviewed the following medical problems during today's office visit >>     Severe Alzheimer's> pt won't look at me, won't talk to me, daughter says intermittently responsive, remains on Aricept10 & Namenda10Bid; DNR & MOST form prev completed.    HBP, CAD> on Labet200Bid, Amlod5, K10-2/d; BP= 116/70 & she has no complaints, daugh confirms clinically stable...    Chol> on diet alone & last FLP was yrs ago as they are unable to get her into office fasting; not interested in med rx given her comorbidities...    DM> on Glucovance 2.5/500 that daughter administers based on SS> 0- 1/2- 1 depending on her sugars; Last labs 12/15 showed BS=108, A1c=6.0; she usually gets 1/2 tab Qam.    GI- HH, Divertics, IBS> stable, needs help w/ toileting, eating, etc...    DJD, LBP> she takes ASA as needed for pain but really doesn't complain much at all... We reviewed prob list, meds, xrays and labs> see below for updates >>  PLAN>>  Continue same medical regimen and total care by family at home...           Problem List:   STRABISMUS - she has a remarkable strabismus, but vision seems adeq per family hx & given her severe underlying dementia they have decided not to pursue this prob w/ ophthalmology.  HYPERTENSION (ICD-401.9) - on LABETOLOL Bid, NORVASC /d, off Lasix & KCl... ~  CXR 12/09 showed mild cardiomeg, tort Ao, clear lungs x basilar atx... ~  10/11: BP= 140/80 & tolerates meds well... denies HA, fatigue, visual changes, CP, palipit, dizziness, syncope,  dyspnea, edema, etc... but as noted she has dementia and denies all symptoms. ~  1/13:  BP= 110/68 & she remains asymptomatic 7 doing satis according to family... ~  7/14: on Labet200Bid, Amlod10; BP= 140/74 & she has no complaints, daugh confirms clinically stable ~  2/15: on Labet200Bid, Amlod11/2tab, K10-2/d; BP= 130/72 & she has no complaints, daugh confirms clinically stable ~  7/15: on Labet200Bid, Amlod5, K10-2/d; BP= 120/70 & she has no complaints, daugh confirms clinically stable ~  12/15: on Labet200Bid, Amlod5, K10-2/d; BP= 114/60 ~  6/16:   on Labet200Bid, Amlod5, K10-2/d; BP= 116/70 & she has no complaints, daugh confirms clinically stable.  CAD (ICD-414.00) ~  Persantine Cardiolite 3/98 was norm w/o ischemia or infarct, EF=72%... ~  cath 12/99 showed Ao root dil, 2 vessel non-obstructive CAD w/ 20-30% lesions, EF=70%...  HYPERCHOLESTEROLEMIA (ICD-272.0) - prev on Zetia, now on diet alone...  family unable to bring her into the office for FASTING blood work.  DM (ICD-250.00) - on GLUCOVANCE 2.5-500 by "sliding scale" per daughter> ie 0- 1/2- 1 depending on her sugars... ~  labs 3/09 showed BS= 96, HgA1c= 6.0. ~  labs 12/09 in hosp showed BS= 122, HgA1c= 6.2. ~  labs 10/11 showed BS= 106, A1c= 5.8.Marland KitchenMarland Kitchen this is prob too tight control & rec to decr Glucovance to 1/2 tab Qam only. ~  Labs 1/13 showed BS= 119, A1c= 5.5.[name]. daugh uses her sliding scal before giving her med... ~  7/14: on Glucovance 2.5/500 that daughter administers based on SS> 0- 1/2- 1 depending on her sugars; random BS today= 142, A1c=5.7; I told her control was still tight & she could loosen it up a little. ~  2/15: on Glucovance 2.5/500 that daughter administers based on SS> 0- 1/2- 1 depending on her sugars; random BS today= 146, A1c=5.6 ~  7/15: on Glucovance 2.5/500 that daughter administers based on SS> 0- 1/2-  1 depending on her sugars; Labs today showed BS=218, A1c=5.6 ~  12/15: on Glucov2.5/500-1/2daily w/  BS=108, A1c=6.0; rec to continue same... ~  6/16: she's been getting ~1/2 Glucovance Qam on ave w/ home sugar checks all good- no hypoglycemic episodes...  HIATAL HERNIA (ICD-553.3) - no longer taking her PPI therapy- prev Nexium Rx... ~  last EGD was 11/96 by DrBrodie showing deformed pylorus c/w chr PUD, some gastric retension. ~  Ba swallow 11/08 was essent neg- sm HH, no reflux...  DIVERTICULOSIS OF COLON (ICD-562.10) - prev Rx w/ Levsin, Metamucil... ~  last colonoscopy was 2/04 by DrBrodie showing divertics, no polyps...   IRRITABLE BOWEL SYNDROME (ICD-564.1)  Hx of RENAL INSUFFICIENCY (ICD-588.9) ~  labs 3/09 showed BUN= 15, Creat= 0.8 ~  labs 12/09 showed BUN= 23, Creat= 0.88 ~  labs 10/11 showed BUN= 19, Creat= 0.8 ~  Labs 1/13 showed BUN= 17, Creat= 0.7 ~  Labs 7/14 showed BUN= 15, Cr= 0.7 ~  Labs 2/15 showed BUN= 14, Cr= 0.7 ~  Labs 7/15 showed BUN= 13, Cr= 0.6  DEGENERATIVE JOINT DISEASE (ICD-715.90) - on TRAMADOL Prn...  BACK PAIN, LUMBAR (ICD-724.2)  VITAMIN D DEFICIENCY >>  ~  Labs 7/14 showed VitD level = 24 & they are rec to take Women's formula MVI & Vit D supplement ~2000u daily...  TIA (ICD-435.9) - on ASA 81mg /d...  ~  CT Brain 12/09 showed atherosclerotic changes, atrophy, NAD.[name]. ~ 7/14:  Daughter denies any cerebral ischemic symptoms...  Hx of HEADACHE (ICD-784.0) -  prev Rx w/ on Neurontin 300Qhs... eval by DrWillis 8/08...   SENILE DEMENTIA (ICD-290.0) - on ARICEPT 10mg /d & NAMENDA 10mg Bid... family cares for pt at home and they do a beautiful job... they have to put meds in her food otherw she won't take them... ~  CT Brain 12/09 showed atrophy & sm vessel dis, no acute process... ~  Clinically she has progressive severe senile dementia & she is a total care pt...  Hx of BELL'S PALSY, RIGHT (ICD-351.0) - ** see above ** Hosp overnight 04/21/08 w/ right facial weakness c/w Bell's Palsey... Rx w/ Acyclovir and improved...  SHINGLES, HX OF  (ICD-V13.8)  Hx of ANEMIA (ICD-285.9) ~  Labs 4/11 showed Hg= 11.8, MCV= 92 ~  Labs 1/13 showed Hg= 12.5, MCV= 92 ~  Labs 7/14 showed Hg= 12.1 ~  Labs 7/15 showed Hg= 12.2   Past Surgical History  Procedure Laterality Date  . Cholecystectomy    . Hiatal hernia repair      Outpatient Encounter Prescriptions as of 10/08/2014  Medication Sig  . amLODipine (NORVASC) 10 MG tablet Take 10 mg by mouth daily.  [name] amLODipine (NORVASC) 10 MG tablet TAKE 1/2 TABLET BY MOUTH ONCE DAILY  . Cholecalciferol (VITAMIN D) 2000 UNITS CAPS Take 1 capsule by mouth daily.  [name] donepezil (ARICEPT) 10 MG tablet TAKE 1 TABLET BY MOUTH ONCE A DAY  . donepezil (ARICEPT) 10 MG tablet TAKE 1 TABLET BY MOUTH ONCE A DAY  . glyBURIDE-metformin (GLUCOVANCE) 2.5-500 MG per tablet TAKE 1/2 TABLET BY MOUTH in the mornings  . KLOR-CON 10 10 MEQ tablet TAKE 2 TABLETS BY MOUTH EVERY DAY  . labetalol (NORMODYNE) 200 MG tablet TAKE 1 TABLET BY MOUTH TWICE A DAY  . Multiple Vitamins-Minerals (WOMENS MULTIVITAMIN PLUS PO) Take 1 tablet by mouth daily.  . traMADol (ULTRAM) 50 MG tablet TAKE 1 TABLET three times  A DAY AS NEEDED FOR ARTHRITIS PAIN  . [DISCONTINUED] traMADol (ULTRAM) 50 MG tablet  TAKE 1 TABLET three times  A DAY AS NEEDED FOR ARTHRITIS PAIN   No facility-administered encounter medications on file as of 10/08/2014.    Allergies  Allergen Reactions  . Atorvastatin     REACTION: increased myalgia/CPK  . Pioglitazone     REACTION: swelling    Current Medications, Allergies, Past Medical History, Past Surgical History, Family History, and Social History were reviewed in [employer] record.   Review of Systems        See HPI - all other systems neg except as noted... The patient complains of decreased hearing, dyspnea on exertion, muscle weakness, and difficulty walking.  The patient denies anorexia, fever, weight loss, weight gain, vision loss, hoarseness, chest pain, syncope,  peripheral edema, prolonged cough, headaches, hemoptysis, abdominal pain, melena, hematochezia, severe indigestion/heartburn, hematuria, incontinence, suspicious skin lesions, transient blindness, depression, unusual weight change, abnormal bleeding, enlarged lymph nodes, and angioedema.    Objective:   Physical Exam     WD, Overweight, 81 y/o BF in NAD... GENERAL:  Lethargic & poorly responsive- she has an obvious dementia... HEENT:  [place]/AT, marked strabismus, EACs-cerumne bilaterlly,  NOSE-clear, THROAT-clear & wnl. Min residual right facial weakness seen... NECK:  Supple w/ fair ROM; no JVD; normal carotid impulses w/o bruits; no thyromegaly or nodules palpated; no lymphadenopathy. CHEST:  Clear to P & A; without wheezes/ rales/ or rhonchi. HEART:  Regular Rhythm; without murmurs/ rubs/ or gallops. ABD:  obese, soft, non-tender;  no organomegaly or masses palpated... EXT: without deformities, mod arthritic changes; no varicose veins/ +venous insuffic/ no edema. NEURO: pleasantly confused DERM:  No lesions noted; no rash etc...  RADIOLOGY DATA:  Reviewed in the EPIC EMR & discussed w/ the patient...  LABORATORY DATA:  Reviewed in the EPIC EMR & discussed w/ the patient...   Assessment:      HBP>  Controlled on Labetolol & Amlodipine;  Continue same...  CAD>  She denies CP, palpit, SOB;  Family confirms stable w/o cardiac symptoms...  CHOL>  On diet alone;  They are un able to bring her into our lab for fasting blood work...  DM>  On Glucovance 2.5/500 which daugh administers as a SS based on BS;  With A1c at 6.0 she is stable on current regimen...  GI> HH, Divertics, IBS>  There are no GI symptoms and family confirms adeq intake, output, etc...  Renal Insuffic>  Renal function is normal attesting to good care by daughter, well hydrated, etc...  DJD, LBP>  She does not complain anymore but if she appears to be in pain daugh will Rx w/ ASA vs Tylenol...  NEURO> Senile dementia,  Hx TIA>  On Aricept & Namenda; doubt much benefit at this point & asked daugh to assess need/ benefit to continuing...  Hx Anemia>  Hg is improved to 12.2, no recurrent vag bleeding etc...     Plan:     Patient's Medications  New Prescriptions   No medications on file  Previous Medications   AMLODIPINE (NORVASC) 10 MG TABLET    Take 10 mg by mouth daily.   AMLODIPINE (NORVASC) 10 MG TABLET    TAKE 1/2 TABLET BY MOUTH ONCE DAILY   CHOLECALCIFEROL (VITAMIN D) 2000 UNITS CAPS    Take 1 capsule by mouth daily.   DONEPEZIL (ARICEPT) 10 MG TABLET    TAKE 1 TABLET BY MOUTH ONCE A DAY   DONEPEZIL (ARICEPT) 10 MG TABLET    TAKE 1 TABLET BY MOUTH ONCE A DAY  GLYBURIDE-METFORMIN (GLUCOVANCE) 2.5-500 MG PER TABLET    TAKE 1/2 TABLET BY MOUTH in the mornings   KLOR-CON 10 10 MEQ TABLET    TAKE 2 TABLETS BY MOUTH EVERY DAY   LABETALOL (NORMODYNE) 200 MG TABLET    TAKE 1 TABLET BY MOUTH TWICE A DAY   MULTIPLE VITAMINS-MINERALS (WOMENS MULTIVITAMIN PLUS PO)    Take 1 tablet by mouth daily.  Modified Medications   Modified Medication Previous Medication   TRAMADOL (ULTRAM) 50 MG TABLET traMADol (ULTRAM) 50 MG tablet      TAKE 1 TABLET three times  A DAY AS NEEDED FOR ARTHRITIS PAIN    TAKE 1 TABLET three times  A DAY AS NEEDED FOR ARTHRITIS PAIN  Discontinued Medications   No medications on file

## 2014-10-08 NOTE — Patient Instructions (Signed)
Today we updated your med list in our EPIC system...    Continue your current medications the same...  Keep up the great care!!!  Call for any questions or if we can be of service in any way...  Let's plan a follow up visit in 12mo.Marland KitchenMarland Kitchen

## 2014-10-21 ENCOUNTER — Other Ambulatory Visit: Payer: Self-pay | Admitting: Pulmonary Disease

## 2014-11-23 ENCOUNTER — Telehealth: Payer: Self-pay | Admitting: Pulmonary Disease

## 2014-11-23 MED ORDER — GLYBURIDE-METFORMIN 2.5-500 MG PO TABS: ORAL_TABLET | ORAL | Status: DC

## 2014-11-23 MED ORDER — METHYLPREDNISOLONE 4 MG PO TABS: ORAL_TABLET | ORAL | Status: DC

## 2014-11-23 MED ORDER — AZITHROMYCIN 250 MG PO TABS: ORAL_TABLET | ORAL | Status: DC

## 2014-11-23 NOTE — Telephone Encounter (Signed)
Per SN: yes give Zpak # 1 take as directed no refills, Medrol Dosepak and OTC Mucinex 600 mg 1p-2 po BID and extra fluids.

## 2014-11-23 NOTE — Telephone Encounter (Signed)
Spoke with the pt's daughter  Pt c/o chest congestion  She has some cough but non prod and "can here rattling in chest"  She states that the robitussin is not helping at all anymore  She has not tried mucinex and I advised that she try this, but she thought SN may want to call something in for her  Please advise thanks! Allergies  Allergen Reactions  . Atorvastatin     REACTION: increased myalgia/CPK  . Pioglitazone     REACTION: swelling

## 2014-11-23 NOTE — Telephone Encounter (Signed)
Pt daughter aware of recs. RX's sent in. Nothing further needed

## 2014-12-17 ENCOUNTER — Telehealth: Payer: Self-pay | Admitting: *Deleted

## 2014-12-17 NOTE — Telephone Encounter (Signed)
PA intiated thru Cover My Meds for Glyburide-Metformin. [employer] Price Pt ID: [id number]. Will await response.

## 2014-12-18 NOTE — Telephone Encounter (Signed)
Received denial for Glyburide-Metformin. Ref # [id number]  Will send message to Fleet Contras and will fax appeal information to her.

## 2015-01-04 NOTE — Telephone Encounter (Signed)
Have not received appeal form

## 2015-01-04 NOTE — Telephone Encounter (Signed)
Appeal faxed on 12/18/14. You can call insurance to request another form. Thanks.

## 2015-04-09 ENCOUNTER — Ambulatory Visit: Payer: Medicaid Other | Admitting: Pulmonary Disease

## 2015-04-12 ENCOUNTER — Other Ambulatory Visit (INDEPENDENT_AMBULATORY_CARE_PROVIDER_SITE_OTHER): Payer: Medicare Other

## 2015-04-12 ENCOUNTER — Ambulatory Visit (INDEPENDENT_AMBULATORY_CARE_PROVIDER_SITE_OTHER): Payer: Medicare Other | Admitting: Pulmonary Disease

## 2015-04-12 ENCOUNTER — Encounter: Payer: Self-pay | Admitting: Pulmonary Disease

## 2015-04-12 VITALS — BP 104/60 | HR 57 | Temp 97.9°F | Ht 60.0 in | Wt 165.0 lb

## 2015-04-12 DIAGNOSIS — K5730 Diverticulosis of large intestine without perforation or abscess without bleeding: Secondary | ICD-10-CM

## 2015-04-12 DIAGNOSIS — M159 Polyosteoarthritis, unspecified: Secondary | ICD-10-CM

## 2015-04-12 DIAGNOSIS — E7800 Pure hypercholesterolemia, unspecified: Secondary | ICD-10-CM

## 2015-04-12 DIAGNOSIS — I10 Essential (primary) hypertension: Secondary | ICD-10-CM

## 2015-04-12 DIAGNOSIS — D649 Anemia, unspecified: Secondary | ICD-10-CM

## 2015-04-12 DIAGNOSIS — F0390 Unspecified dementia without behavioral disturbance: Secondary | ICD-10-CM

## 2015-04-12 DIAGNOSIS — K449 Diaphragmatic hernia without obstruction or gangrene: Secondary | ICD-10-CM

## 2015-04-12 DIAGNOSIS — M150 Primary generalized (osteo)arthritis: Secondary | ICD-10-CM

## 2015-04-12 DIAGNOSIS — E118 Type 2 diabetes mellitus with unspecified complications: Secondary | ICD-10-CM

## 2015-04-12 DIAGNOSIS — I2510 Atherosclerotic heart disease of native coronary artery without angina pectoris: Secondary | ICD-10-CM

## 2015-04-12 DIAGNOSIS — Z23 Encounter for immunization: Secondary | ICD-10-CM

## 2015-04-12 DIAGNOSIS — N259 Disorder resulting from impaired renal tubular function, unspecified: Secondary | ICD-10-CM

## 2015-04-12 DIAGNOSIS — G458 Other transient cerebral ischemic attacks and related syndromes: Secondary | ICD-10-CM

## 2015-04-12 LAB — CBC WITH DIFFERENTIAL/PLATELET
BASOS PCT: 0.6 % (ref 0.0–3.0)
Basophils Absolute: 0 10*3/uL (ref 0.0–0.1)
EOS PCT: 2.5 % (ref 0.0–5.0)
Eosinophils Absolute: 0.2 10*3/uL (ref 0.0–0.7)
HEMATOCRIT: 36.7 % (ref 36.0–46.0)
HEMOGLOBIN: 12 g/dL (ref 12.0–15.0)
LYMPHS PCT: 34.1 % (ref 12.0–46.0)
Lymphs Abs: 2.3 10*3/uL (ref 0.7–4.0)
MCHC: 32.7 g/dL (ref 30.0–36.0)
MCV: 91.9 fl (ref 78.0–100.0)
MONOS PCT: 8.7 % (ref 3.0–12.0)
Monocytes Absolute: 0.6 10*3/uL (ref 0.1–1.0)
NEUTROS ABS: 3.6 10*3/uL (ref 1.4–7.7)
Neutrophils Relative %: 54.1 % (ref 43.0–77.0)
PLATELETS: 200 10*3/uL (ref 150.0–400.0)
RBC: 3.99 Mil/uL (ref 3.87–5.11)
RDW: 13.6 % (ref 11.5–15.5)
WBC: 6.6 10*3/uL (ref 4.0–10.5)

## 2015-04-12 LAB — BASIC METABOLIC PANEL
BUN: 15 mg/dL (ref 6–23)
CALCIUM: 9.5 mg/dL (ref 8.4–10.5)
CHLORIDE: 112 meq/L (ref 96–112)
CO2: 22 meq/L (ref 19–32)
Creatinine, Ser: 0.69 mg/dL (ref 0.40–1.20)
GFR: 103.05 mL/min (ref >60.00)
GLUCOSE: 77 mg/dL (ref 70–99)
Potassium: 4.3 mEq/L (ref 3.5–5.1)
SODIUM: 143 meq/L (ref 135–145)

## 2015-04-12 LAB — HEMOGLOBIN A1C: HEMOGLOBIN A1C: 5.7 % (ref 4.6–6.5)

## 2015-04-12 LAB — HEPATIC FUNCTION PANEL
ALK PHOS: 75 U/L (ref 39–117)
ALT: 11 U/L (ref 0–35)
AST: 16 U/L (ref 0–37)
Albumin: 3.5 g/dL (ref 3.5–5.2)
BILIRUBIN DIRECT: 0 mg/dL (ref 0.0–0.3)
Total Bilirubin: 0.3 mg/dL (ref 0.2–1.2)
Total Protein: 7.2 g/dL (ref 6.0–8.3)

## 2015-04-12 LAB — TSH: TSH: 2.9 u[IU]/mL (ref 0.35–4.50)

## 2015-04-12 NOTE — Progress Notes (Signed)
Subjective:     Patient ID: Sonya Price, female   DOB: 03-10-1925, 81 y.o.   MRN: [id number]  HPI 81 y/o BF here for a follow up visit... she has mult med problems as noted below...   ~  May 08, 2011:  71mo ROV & they report episode of vag bleeding, seen at [facility] ER & blood clot seen in vag vault, eval was otherw neg, nothing found & the symptom resolved;  LABS showed Hg=12.4;  Sonars were neg; they decided on watchful waiting & all symptoms resolved w/o recurrence...    Severe Alzheimer's> pt won't look at me, won't talk to me, daughter says intermittently responsive, remains on Aricept & Namenda...    HBP, CAD> on Labet200Bid, Amlod10; BP= 110/68 & she has no complaints, daugh confirms clinically stable...    Chol> on diet alone & last FLP was yrs ago as they are unable to get her into office fasting; not interested in med rx given her comorbidities...    DM> on Glucovance 2.5/500 that daught er administers based on SS> 0- 1/2- 1 depending on her sugars; random BS today= 119, A1c=5.5; I told her control was tight & she could loosen it up a tad...    GI- HH, Divertics, IBS> stable, needs help w/ toileting, eating, etc...    DJD, LBP> she takes ASA as needed for pain but really doesn't complain much at all...  ~  November 04, 2012:  53mo ROV & brought in by daughter for check> pt is poorly responsive w/ severe dementia, virtually mute w/ occas vocalizations, daugh notes intertrig rash (Rx Lotrisone cream prn), pt has to be fed, keep eyes closed much of the time, but no complaints!    Severe Alzheimer's> pt won't look at me, won't talk to me, daughter says intermittently responsive, remains on Aricept & Namenda; DNR & MOST form completed.    HBP, CAD> on Labet200Bid, Amlod10; BP= 140/74 & she has no complaints, daugh confirms clinically stable...    Chol> on diet alone & last FLP was yrs ago as they are unable to get her into office fasting; not interested in med rx given her comorbidities...     DM> on Glucovance 2.5/500 that daughter administers based on SS> 0- 1/2- 1 depending on her sugars; random BS today= 142, A1c=5.7; I told her control was still tight & she could loosen it up a little...    GI- HH, Divertics, IBS> stable, needs help w/ toileting, eating, etc...    DJD, LBP> she takes ASA as needed for pain but really doesn't complain much at all... We reviewed prob list, meds, xrays and labs> see below for updates >>   LABS 7/14:  Chems- ok x K=3.2 & we added K20/d, BS=142 A1c=5.7 on Glucov 1/2 tab prn only;  CBC- ok w/ Hg=12.1;  TSH=1.78;  VitD=24 & rc to add MVI + 2000uVitD...  ~  June 12, 2013:  25mo ROV & daughter brought her in for face-to-face so she can get a Hoyer-Lift to aid in her home care needs... She notes that mom can't stand, can't walk, can't sit up on her own, drools, has to be fed, incontinent & wears depends, etc; she is a total care pt & considered permanently & totally disabled;  She is a NCB, comfort care only...     Severe Alzheimer's> pt won't look at me, won't talk to me, daughter says intermittently responsive, remains on Aricept10 & Namenda10Bid; DNR & MOST form prev completed.  HBP, CAD> on Labet200Bid, Amlod11/2tab, K10-2/d; BP= 130/72 & she has no complaints, daugh confirms clinically stable...    Chol> on diet alone & last FLP was yrs ago as they are unable to get her into office fasting; not interested in med rx given her comorbidities...    DM> on Glucovance 2.5/500 that daughter administers based on SS> 0- 1/2- 1 depending on her sugars; random BS today= 146, A1c=5.6; I told her control was still tight & she could loosen up the SS a little...    GI- HH, Divertics, IBS> stable, needs help w/ toileting, eating, etc...    DJD, LBP> she takes ASA as needed for pain but really doesn't complain much at all... We reviewed prob list, meds, xrays and labs> see below for updates >> Rx for Depends, chux pads, OK Hoyer lift...  LABS 2/15:  Chems- ok w/  BS=146, A1c=5.6, BUN=14, Cr=0.7, K=3.8.Marland KitchenMarland Kitchen  ~  November 04, 2013:  14mo ROV & Sonya Price is stable, cared for very well by her daughter; Sonya Price has very severe end-stage Alz dis & is virtually non-communicative, total care patient... The hoyer lift has really helped w/ her care at home...     Severe Alzheimer's> pt won't look at me, won't talk to me, daughter says intermittently responsive, remains on Aricept10 & Namenda10Bid; DNR & MOST form prev completed.    HBP, CAD> on Labet200Bid, Amlod5, K10-2/d; BP= 120/70 & she has no complaints, daugh confirms clinically stable...    Chol> on diet alone & last FLP was yrs ago as they are unable to get her into office fasting; not interested in med rx given her comorbidities...    DM> on Glucovance 2.5/500 that daughter administers based on SS> 0- 1/2- 1 depending on her sugars; Labs today showed BS=218, A1c=5.6    GI- HH, Divertics, IBS> stable, needs help w/ toileting, eating, etc...    DJD, LBP> she takes ASA as needed for pain but really doesn't complain much at all... We reviewed prob list, meds, xrays and labs> see below for updates >>   LABS 7/15:  Chems- ok x BS=218, A1c=5.6, Alb=3.3;  CBC- wnl w/ Hg=12.2;  TSH=1.60...  ~  April 08, 2014:  14mo ROV & daughter [name] continues to do total care for Sonya Price> she reports stable medically, notes pt will occas interact w/ her, occas cooperate to help w/ her care needs/ exercise/ etc; otherw she remains a total care pt, they do passive ROM exercises mostly, & they use the hoyer lift to get her up...     She has a very severe end-stage senile dementia w/ MMSE 0/30; stable on Aricept10 & Namenda10Bid, they wish to continue same & not switch to Namzeric at this time...    BP is regulated w/ Labetolol200Bid, Amlodipine10, K10- 2daily; BP= 114/60 today...    She remains on Glucovance 2.5/500- taking 1/2 tab Qam; Labs today showed BS= 108, A1c= 6.0; continue same...    She also continues on VitD2000 & Tramadol50 prn... We  reviewed prob list, meds, xrays and labs> see below for updates >>   LABS 12/15:  Chems- ok w/ BS=108, A1c=6.0.Marland KitchenMarland Kitchen  ~  October 08, 2014:  39mo ROV & [name] brings Momma back for a follow up visit- she remains a total care pt for daughter who continues to do a fabulous job w/ her care; Burkley has severe end-stage dementia (mute, unresponsive, incapable of participating in her own care);  Medically she has been stable w/o resp or CV symptoms etc..[name]  We reviewed the following medical problems during today's office visit >>     Severe Alzheimer's> pt won't look at me, won't talk to me, daughter says intermittently responsive, remains on Aricept10 & Namenda10Bid; DNR & MOST form prev completed.    HBP, CAD> on Labet200Bid, Amlod5, K10-2/d; BP= 116/70 & she has no complaints, daugh confirms clinically stable...    Chol> on diet alone & last FLP was yrs ago as they are unable to get her into office fasting; not interested in med rx given her comorbidities...    DM> on Glucovance 2.5/500 that daughter administers based on SS> 0- 1/2- 1 depending on her sugars; Last labs 12/15 showed BS=108, A1c=6.0; she usually gets 1/2 tab Qam.    GI- HH, Divertics, IBS> stable, needs help w/ toileting, eating, etc...    DJD, LBP> she takes ASA as needed for pain but really doesn't complain much at all... We reviewed prob list, meds, xrays and labs> see below for updates >>  PLAN>>  Continue same medical regimen and total care by family at home...  ~  April 12, 2015:  58mo ROV & Sonya Price continues the thrive under her daughter's care- despite her severe dementia; no new issues per daughter care giver- uses Mucinex prn congestion, and she tells me that insurance won't cover the Glucovance=> we decided to change to Metformin500- 1/2 Qam & [name] will follow her sugars at home... We reviewed the following medical problems during today's office visit >>     Severe Alzheimer's> pt won't look at me, won't talk to me, daughter says  intermittently responsive, remains on generic Aricept10 & Namenda10Bid; DNR & MOST form prev completed.    HBP, CAD> on Labet200Bid, Amlod5, K10-2/d; BP= 104/60 & she has no complaints, daugh confirms clinically stable...    Chol> on diet alone & last FLP was yrs ago as they are unable to get her into office fasting; not interested in med rx given her comorbidities...    DM> on Glucovance 2.5/500 that daughter administers based on SS> 0- 1/2- 1 depending on her sugars; labs 12/16 showed BS=77, A1c=5.7; we decided to change to Metform500-1/2 Qam.    GI- HH, Divertics, IBS> stable, needs help w/ toileting, eating, etc...    DJD, LBP> she takes Tramadol50 as needed for pain but really doesn't complain much at all... EXAM shows Afeb, VSS, O2sat=98% on RA;  HEENT- neg, mallampati3;  Chest- clear w/o w/r/r;  Heart- RR w/o m/r/g;  Abd- obses soft, nontender;  Ext- neg x DJD, no c/c/e;  Neuro- end-stage Alzheimers...  LABS 04/12/15>  Chems- ok w/ BS=77, A1c=5.7 on Glucovance 1/2 Qam;  CBC- ok w/ Hg=12.0;  TSH=2.90... Rec to change DM rx to Metform500-1/2Qam... IMP/PLAN>>  Nakai remains stable w/ the wonderful care provided by her daughter; we decided to ch the Glucovance to Metform500- 1/2 tab Qam...           Problem List:   STRABISMUS - she has a remarkable strabismus, but vision seems adeq per family hx & given her severe underlying dementia they have decided not to pursue this prob w/ ophthalmology.  HYPERTENSION (ICD-401.9) - on LABETOLOL 200mg Bid, NORVASC 10mg /d, off Lasix & KCl... ~  CXR 12/09 showed mild cardiomeg, tort Ao, clear lungs x basilar atx... ~  10/11: BP= 140/80 & tolerates meds well... denies HA, fatigue, visual changes, CP, palipit, dizziness, syncope, dyspnea, edema, etc... but as noted she has dementia and denies all symptoms. ~  1/13:  BP= 110/68 & she  remains asymptomatic 7 doing satis according to family... ~  7/14: on Labet200Bid, Amlod10; BP= 140/74 & she has no complaints,  daugh confirms clinically stable ~  2/15: on Labet200Bid, Amlod11/2tab, K10-2/d; BP= 130/72 & she has no complaints, daugh confirms clinically stable ~  7/15: on Labet200Bid, Amlod5, K10-2/d; BP= 120/70 & she has no complaints, daugh confirms clinically stable ~  12/15: on Labet200Bid, Amlod5, K10-2/d; BP= 114/60 ~  6/16: on Labet200Bid, Amlod5, K10-2/d; BP= 116/70 & she has no complaints, daugh confirms clinically stable. ~  12/16: on Labet200Bid, Amlod5, K10-2/d; BP= 104/60 & she has no complaints, daugh confirms clinically stable...  CAD (ICD-414.00) ~  Persantine Cardiolite 3/98 was norm w/o ischemia or infarct, EF=72%... ~  cath 12/99 showed Ao root dil, 2 vessel non-obstructive CAD w/ 20-30% lesions, EF=70%...  HYPERCHOLESTEROLEMIA (ICD-272.0) - prev on Zetia, now on diet alone...  family unable to bring her into the office for FASTING blood work.  DM (ICD-250.00) - on GLUCOVANCE 2.5-500 by "sliding scale" per daughter> ie 0- 1/2- 1 depending on her sugars... ~  labs 3/09 showed BS= 96, HgA1c= 6.0. ~  labs 12/09 in hosp showed BS= 122, HgA1c= 6.2. ~  labs 10/11 showed BS= 106, A1c= 5.8.Marland KitchenMarland Kitchen this is prob too tight control & rec to decr Glucovance to 1/2 tab Qam only. ~  Labs 1/13 showed BS= 119, A1c= 5.5.[name]. daugh uses her sliding scal before giving her med... ~  7/14: on Glucovance 2.5/500 that daughter administers based on SS> 0- 1/2- 1 depending on her sugars; random BS today= 142, A1c=5.7; I told her control was still tight & she could loosen it up a little. ~  2/15: on Glucovance 2.5/500 that daughter administers based on SS> 0- 1/2- 1 depending on her sugars; random BS today= 146, A1c=5.6 ~  7/15: on Glucovance 2.5/500 that daughter administers based on SS> 0- 1/2- 1 depending on her sugars; Labs today showed BS=218, A1c=5.6 ~  12/15: on Glucov2.5/500-1/2daily w/ BS=108, A1c=6.0; rec to continue same... ~  6/16: she's been getting ~1/2 Glucovance Qam on ave w/ home sugar checks all  good- no hypoglycemic episodes... ~  12/16: on Glucovance 2.5/500 that daughter administers based on SS> 0- 1/2- 1 depending on her sugars; labs 12/16 showed BS=77, A1c=5.7; we decided to change to Metform500-1/2 Qam.  HIATAL HERNIA (ICD-553.3) - no longer taking her PPI therapy- prev Nexium Rx... ~  last EGD was 11/96 by DrBrodie showing deformed pylorus c/w chr PUD, some gastric retension. ~  Ba swallow 11/08 was essent neg- sm HH, no reflux...  DIVERTICULOSIS OF COLON (ICD-562.10) - prev Rx w/ Levsin, Metamucil... ~  last colonoscopy was 2/04 by DrBrodie showing divertics, no polyps...   IRRITABLE BOWEL SYNDROME (ICD-564.1)  Hx of RENAL INSUFFICIENCY (ICD-588.9) ~  labs 3/09 showed BUN= 15, Creat= 0.8 ~  labs 12/09 showed BUN= 23, Creat= 0.88 ~  labs 10/11 showed BUN= 19, Creat= 0.8 ~  Labs 1/13 showed BUN= 17, Creat= 0.7 ~  Labs 7/14 showed BUN= 15, Cr= 0.7 ~  Labs 2/15 showed BUN= 14, Cr= 0.7 ~  Labs 7/15 showed BUN= 13, Cr= 0.6  DEGENERATIVE JOINT DISEASE (ICD-715.90) - on TRAMADOL Prn...  BACK PAIN, LUMBAR (ICD-724.2)  VITAMIN D DEFICIENCY >>  ~  Labs 7/14 showed VitD level = 24 & they are rec to take Women's formula MVI & Vit D supplement ~2000u daily...  TIA (ICD-435.9) - on ASA 81mg /d...  ~  CT Brain 12/09 showed atherosclerotic changes, atrophy, NAD.Marland KitchenMarland Kitchen ~  7/14:  Daughter denies any cerebral ischemic symptoms...  Hx of HEADACHE (ICD-784.0) -  prev Rx w/ on Neurontin 300Qhs... eval by DrWillis 8/08...   SENILE DEMENTIA (ICD-290.0) - on ARICEPT 10mg /d & NAMENDA 10mg Bid... family cares for pt at home and they do a beautiful job... they have to put meds in her food otherw she won't take them... ~  CT Brain 12/09 showed atrophy & sm vessel dis, no acute process... ~  Clinically she has progressive severe senile dementia & she is a total care pt...  Hx of BELL'S PALSY, RIGHT (ICD-351.0) - ** see above ** Hosp overnight 04/21/08 w/ right facial weakness c/w Bell's Palsey... Rx  w/ Acyclovir and improved...  SHINGLES, HX OF (ICD-V13.8)  Hx of ANEMIA (ICD-285.9) ~  Labs 4/11 showed Hg= 11.8, MCV= 92 ~  Labs 1/13 showed Hg= 12.5, MCV= 92 ~  Labs 7/14 showed Hg= 12.1 ~  Labs 7/15 showed Hg= 12.2   Past Surgical History  Procedure Laterality Date  . Cholecystectomy    . Hiatal hernia repair      Outpatient Encounter Prescriptions as of 04/12/2015  Medication Sig  . amLODipine (NORVASC) 10 MG tablet TAKE 1/2 TABLET BY MOUTH ONCE DAILY  . Cholecalciferol (VITAMIN D) 2000 UNITS CAPS Take 1 capsule by mouth daily.  [name]. donepezil (ARICEPT) 10 MG tablet TAKE 1 TABLET BY MOUTH ONCE A DAY  . labetalol (NORMODYNE) 200 MG tablet TAKE 1 TABLET BY MOUTH TWICE A DAY  . memantine (NAMENDA) 10 MG tablet Take 10 mg by mouth 2 (two) times daily.  . Multiple Vitamins-Minerals (WOMENS MULTIVITAMIN PLUS PO) Take 1 tablet by mouth daily.  . potassium chloride (K-DUR) 10 MEQ tablet TAKE 2 TABLETS BY MOUTH EVERY DAY  . traMADol (ULTRAM) 50 MG tablet TAKE 1 TABLET three times  A DAY AS NEEDED FOR ARTHRITIS PAIN  .  glyBURIDE-metformin (GLUCOVANCE) 2.5-500 MG per tablet TAKE 1/2 TABLET BY MOUTH in the mornings  . [DISCONTINUED] azithromycin (ZITHROMAX) 250 MG tablet Take as directed  . [DISCONTINUED] methylPREDNISolone (MEDROL) 4 MG tablet Take as directed   No facility-administered encounter medications on file as of 04/12/2015.    Allergies  Allergen Reactions  . Atorvastatin     REACTION: increased myalgia/CPK  . Pioglitazone     REACTION: swelling    Current Medications, Allergies, Past Medical History, Past Surgical History, Family History, and Social History were reviewed in [facility] electronic medical record.   Review of Systems        See HPI - all other systems neg except as noted... The patient complains of decreased hearing, dyspnea on exertion, muscle weakness, and difficulty walking.  The patient denies anorexia, fever, weight loss, weight gain, vision  loss, hoarseness, chest pain, syncope, peripheral edema, prolonged cough, headaches, hemoptysis, abdominal pain, melena, hematochezia, severe indigestion/heartburn, hematuria, incontinence, suspicious skin lesions, transient blindness, depression, unusual weight change, abnormal bleeding, enlarged lymph nodes, and angioedema.    Objective:   Physical Exam     WD, Overweight, 81 y/o BF in NAD... GENERAL:  Lethargic & poorly responsive- she has an obvious dementia... HEENT:  [place]/AT, marked strabismus, EACs-cerumne bilaterlly,  NOSE-clear, THROAT-clear & wnl. Min residual right facial weakness seen... NECK:  Supple w/ fair ROM; no JVD; normal carotid impulses w/o bruits; no thyromegaly or nodules palpated; no lymphadenopathy. CHEST:  Clear to P & A; without wheezes/ rales/ or rhonchi. HEART:  Regular Rhythm; without murmurs/ rubs/ or gallops. ABD:  obese, soft, non-tender;  no organomegaly or  masses palpated... EXT: without deformities, mod arthritic changes; no varicose veins/ +venous insuffic/ no edema. NEURO: pleasantly confused DERM:  No lesions noted; no rash etc...  RADIOLOGY DATA:  Reviewed in the EPIC EMR & discussed w/ the patient...  LABORATORY DATA:  Reviewed in the EPIC EMR & discussed w/ the patient...   Assessment:      HBP>  Controlled on Labetolol & Amlodipine;  Continue same...  CAD>  She denies CP, palpit, SOB;  Family confirms stable w/o cardiac symptoms...  CHOL>  On diet alone;  They are un able to bring her into our lab for fasting blood work...  DM>  BS=77, A1c=5.7 on Glucovance & insurance won't cover it; therefore switch to Metform500- 1/2 tab Qam...Marland KitchenMarland Kitchen.  GI> HH, Divertics, IBS>  There are no GI symptoms and family confirms adeq intake, output, etc...  Renal Insuffic>  Renal function is normal attesting to good care by daughter, well hydrated, etc...  DJD, LBP>  She does not complain anymore but if she appears to be in pain daugh will Rx w/ ASA vs  Tylenol...  NEURO> Senile dementia, Hx TIA>  On Aricept & Namenda; doubt much benefit at this point & asked daugh to assess need/ benefit to continuing...  Hx Anemia>  Hg is improved to 12.2, no recurrent vag bleeding etc...     Plan:     Patient's Medications  New Prescriptions   No medications on file  Previous Medications   AMLODIPINE (NORVASC) 10 MG TABLET    TAKE 1/2 TABLET BY MOUTH ONCE DAILY   CHOLECALCIFEROL (VITAMIN D) 2000 UNITS CAPS    Take 1 capsule by mouth daily.   DONEPEZIL (ARICEPT) 10 MG TABLET    TAKE 1 TABLET BY MOUTH ONCE A DAY   LABETALOL (NORMODYNE) 200 MG TABLET    TAKE 1 TABLET BY MOUTH TWICE A DAY   MEMANTINE (NAMENDA) 10 MG TABLET    Take 10 mg by mouth 2 (two) times daily.   METFORMIN (GLUCOPHAGE) 500 MG TABLET    Take 250 mg by mouth daily with breakfast.   MULTIPLE VITAMINS-MINERALS (WOMENS MULTIVITAMIN PLUS PO)    Take 1 tablet by mouth daily.   POTASSIUM CHLORIDE (K-DUR) 10 MEQ TABLET    TAKE 2 TABLETS BY MOUTH EVERY DAY   TRAMADOL (ULTRAM) 50 MG TABLET    TAKE 1 TABLET three times  A DAY AS NEEDED FOR ARTHRITIS PAIN  Modified Medications   No medications on file  Discontinued Medications   AMLODIPINE (NORVASC) 10 MG TABLET    Take 10 mg by mouth daily.   AZITHROMYCIN (ZITHROMAX) 250 MG TABLET    Take as directed   GLYBURIDE-METFORMIN (GLUCOVANCE) 2.5-500 MG PER TABLET    TAKE 1/2 TABLET BY MOUTH in the mornings   METHYLPREDNISOLONE (MEDROL) 4 MG TABLET    Take as directed

## 2015-04-12 NOTE — Patient Instructions (Signed)
Today we updated your med list in our EPIC system...    Continue your current medications the same...  We changed the Glucovance to METFORMIN 500mg  tabs- take 1/2 tab each AM...  Today we checked follow up blood work...    We will contact you w/ the results when available...   Keep up the great job of home care!!!  Call for any questions...  Let's plan a follow up visit in 81mo, sooner if needed for problems.[name]..[name]

## 2015-05-17 ENCOUNTER — Telehealth: Payer: Self-pay | Admitting: Pulmonary Disease

## 2015-05-17 MED ORDER — TRAMADOL HCL 50 MG PO TABS: ORAL_TABLET | ORAL | Status: AC

## 2015-05-17 NOTE — Telephone Encounter (Signed)
Per SN >> okay to refill.   Rx has been called in. Pt's daughter is aware. Nothing further was needed.

## 2015-05-17 NOTE — Telephone Encounter (Signed)
Pt is needing refill on tramadol 50 mg tabs and wants 90 day supply. RX has been refilled for 30 day supply in past. Last refilled 10/08/14 #90 x 5 refills TAKE 1 TABLET three times A DAY AS NEEDED FOR ARTHRITIS PAIN Pt last seen 04/12/15 by SN Please advise thanks

## 2015-05-18 ENCOUNTER — Telehealth: Payer: Self-pay | Admitting: Pulmonary Disease

## 2015-05-18 NOTE — Telephone Encounter (Signed)
Form has been completed and placed in SN look at. Will route to [place] to follow up on.

## 2015-05-20 NOTE — Telephone Encounter (Signed)
Form signed and placed up front for patient's son to pick up. Called and spoke with Adela Glimpse (Son) and he is aware that form is ready for pick up. Nothing further needed.

## 2015-08-13 ENCOUNTER — Telehealth: Payer: Self-pay | Admitting: Pulmonary Disease

## 2015-08-13 NOTE — Telephone Encounter (Signed)
[facility], states that she has faxed an order for incontinence supplies on 4/17 and has not yet received that order back.  Order is being refaxed to verified fax # this afternoon. Forwarding to [place] to look out for.

## 2015-08-16 NOTE — Telephone Encounter (Signed)
[facility] I have received new form, will have SN re-sign the form as the old form has been sent off to be scanned.

## 2015-08-17 ENCOUNTER — Telehealth: Payer: Self-pay | Admitting: Pulmonary Disease

## 2015-08-17 NOTE — Telephone Encounter (Signed)
Spoke with Selena BattenKim, caseworker, states that she is needing order for incontinence supplies asap.  I advised that per Robynn Panelise this has been faxed already.  Selena BattenKim is requesting this be refaxed. Robynn Panelise please advise if you still have this form.  If so can we please refax this?  Thanks!

## 2015-08-18 NOTE — Telephone Encounter (Signed)
Form completed and signed by SN. [facility] along with demographics, medlist, and last OV note. Received a confirmation fax. Will sign off.

## 2015-09-02 ENCOUNTER — Telehealth: Payer: Self-pay | Admitting: Pulmonary Disease

## 2015-09-02 MED ORDER — OXYCODONE-ACETAMINOPHEN 5-325 MG/5ML PO SOLN: 5.0000 mL | Freq: Four times a day (QID) | ORAL | Status: AC | PRN

## 2015-09-02 NOTE — Telephone Encounter (Signed)
Spoke with pt daughter, states that patient has started moaning as if she is pain x 2 days.  When asked if she is in pain she does not give a clear answer.  Pt can be heard over the phone moaning. Pt does not "look" to be in any pain - eyes are closed all the time but there is not a look of discomfort on her face.  Pt's BP is 146/101 -- has taken BP meds around 9am Denies seeing any leg swelling - little in ankles  Please advise Dr Kriste BasqueNadel. Thanks.     Medication List       This list is accurate as of: 09/02/15 12:05 PM.  Always use your most recent med list.               amLODipine 10 MG tablet  Commonly known as:  NORVASC  TAKE 1/2 TABLET BY MOUTH ONCE DAILY     donepezil 10 MG tablet  Commonly known as:  ARICEPT  TAKE 1 TABLET BY MOUTH ONCE A DAY     donepezil 10 MG tablet  Commonly known as:  ARICEPT  TAKE 1 TABLET BY MOUTH ONCE A DAY     labetalol 200 MG tablet  Commonly known as:  NORMODYNE  TAKE 1 TABLET BY MOUTH TWICE A DAY     memantine 10 MG tablet  Commonly known as:  NAMENDA  Take 10 mg by mouth 2 (two) times daily.     metFORMIN 500 MG tablet  Commonly known as:  GLUCOPHAGE  Take 250 mg by mouth daily with breakfast.     potassium chloride 10 MEQ tablet  Commonly known as:  K-DUR  TAKE 2 TABLETS BY MOUTH EVERY DAY     traMADol 50 MG tablet  Commonly known as:  ULTRAM  TAKE 1 TABLET three times  A DAY AS NEEDED FOR ARTHRITIS PAIN     Vitamin D 2000 units Caps  Take 1 capsule by mouth daily.     WOMENS MULTIVITAMIN PLUS PO  Take 1 tablet by mouth daily.

## 2015-09-02 NOTE — Telephone Encounter (Signed)
Rx has been printed and signed by SN. Pt's daughter is aware that rx is ready for pick up. This has been placed up front. Nothing further was needed.

## 2015-09-02 NOTE — Telephone Encounter (Signed)
Per SN: try liquid pain med Roxicet 5/235mg  every 6 hours as needed for pain, #8oz.  Thanks.

## 2015-09-03 ENCOUNTER — Encounter (HOSPITAL_COMMUNITY): Payer: Self-pay

## 2015-09-03 ENCOUNTER — Inpatient Hospital Stay (HOSPITAL_COMMUNITY)
Admission: EM | Admit: 2015-09-03 | Discharge: 2015-09-08 | DRG: 871 | Disposition: A | Discharge status: Hospice | Payer: Medicare Other | Attending: Internal Medicine | Admitting: Internal Medicine

## 2015-09-03 ENCOUNTER — Emergency Department (HOSPITAL_COMMUNITY): Payer: Medicare Other

## 2015-09-03 DIAGNOSIS — Z7984 Long term (current) use of oral hypoglycemic drugs: Secondary | ICD-10-CM | POA: Diagnosis not present

## 2015-09-03 DIAGNOSIS — R0602 Shortness of breath: Secondary | ICD-10-CM | POA: Insufficient documentation

## 2015-09-03 DIAGNOSIS — E8770 Fluid overload, unspecified: Secondary | ICD-10-CM | POA: Diagnosis not present

## 2015-09-03 DIAGNOSIS — R400 Somnolence: Secondary | ICD-10-CM | POA: Diagnosis not present

## 2015-09-03 DIAGNOSIS — J690 Pneumonitis due to inhalation of food and vomit: Secondary | ICD-10-CM | POA: Diagnosis not present

## 2015-09-03 DIAGNOSIS — R4182 Altered mental status, unspecified: Secondary | ICD-10-CM | POA: Diagnosis not present

## 2015-09-03 DIAGNOSIS — J9601 Acute respiratory failure with hypoxia: Secondary | ICD-10-CM | POA: Diagnosis present

## 2015-09-03 DIAGNOSIS — Z87891 Personal history of nicotine dependence: Secondary | ICD-10-CM

## 2015-09-03 DIAGNOSIS — E785 Hyperlipidemia, unspecified: Secondary | ICD-10-CM | POA: Diagnosis not present

## 2015-09-03 DIAGNOSIS — Z515 Encounter for palliative care: Secondary | ICD-10-CM | POA: Diagnosis not present

## 2015-09-03 DIAGNOSIS — E872 Acidosis, unspecified: Secondary | ICD-10-CM

## 2015-09-03 DIAGNOSIS — E118 Type 2 diabetes mellitus with unspecified complications: Secondary | ICD-10-CM | POA: Diagnosis not present

## 2015-09-03 DIAGNOSIS — L8990 Pressure ulcer of unspecified site, unspecified stage: Secondary | ICD-10-CM | POA: Diagnosis present

## 2015-09-03 DIAGNOSIS — Z7401 Bed confinement status: Secondary | ICD-10-CM | POA: Diagnosis not present

## 2015-09-03 DIAGNOSIS — D649 Anemia, unspecified: Secondary | ICD-10-CM | POA: Diagnosis present

## 2015-09-03 DIAGNOSIS — Z8673 Personal history of transient ischemic attack (TIA), and cerebral infarction without residual deficits: Secondary | ICD-10-CM

## 2015-09-03 DIAGNOSIS — K449 Diaphragmatic hernia without obstruction or gangrene: Secondary | ICD-10-CM | POA: Diagnosis present

## 2015-09-03 DIAGNOSIS — Z66 Do not resuscitate: Secondary | ICD-10-CM | POA: Diagnosis present

## 2015-09-03 DIAGNOSIS — I10 Essential (primary) hypertension: Secondary | ICD-10-CM | POA: Diagnosis not present

## 2015-09-03 DIAGNOSIS — Z7189 Other specified counseling: Secondary | ICD-10-CM | POA: Diagnosis not present

## 2015-09-03 DIAGNOSIS — F0390 Unspecified dementia without behavioral disturbance: Secondary | ICD-10-CM | POA: Diagnosis present

## 2015-09-03 DIAGNOSIS — R05 Cough: Secondary | ICD-10-CM | POA: Diagnosis not present

## 2015-09-03 DIAGNOSIS — L89152 Pressure ulcer of sacral region, stage 2: Secondary | ICD-10-CM | POA: Diagnosis present

## 2015-09-03 DIAGNOSIS — G9341 Metabolic encephalopathy: Secondary | ICD-10-CM | POA: Diagnosis present

## 2015-09-03 DIAGNOSIS — I2510 Atherosclerotic heart disease of native coronary artery without angina pectoris: Secondary | ICD-10-CM | POA: Diagnosis present

## 2015-09-03 DIAGNOSIS — A419 Sepsis, unspecified organism: Principal | ICD-10-CM | POA: Diagnosis present

## 2015-09-03 DIAGNOSIS — B9620 Unspecified Escherichia coli [E. coli] as the cause of diseases classified elsewhere: Secondary | ICD-10-CM | POA: Diagnosis not present

## 2015-09-03 DIAGNOSIS — I4891 Unspecified atrial fibrillation: Secondary | ICD-10-CM | POA: Diagnosis not present

## 2015-09-03 DIAGNOSIS — N390 Urinary tract infection, site not specified: Secondary | ICD-10-CM | POA: Insufficient documentation

## 2015-09-03 DIAGNOSIS — R112 Nausea with vomiting, unspecified: Secondary | ICD-10-CM | POA: Diagnosis not present

## 2015-09-03 LAB — CBC WITH DIFFERENTIAL/PLATELET
BASOS ABS: 0 10*3/uL (ref 0.0–0.1)
Basophils Relative: 0 %
EOS ABS: 0 10*3/uL (ref 0.0–0.7)
EOS PCT: 0 %
HCT: 39.8 % (ref 36.0–46.0)
HEMOGLOBIN: 13.5 g/dL (ref 12.0–15.0)
LYMPHS ABS: 1.4 10*3/uL (ref 0.7–4.0)
LYMPHS PCT: 16 %
MCH: 30.5 pg (ref 26.0–34.0)
MCHC: 33.9 g/dL (ref 30.0–36.0)
MCV: 89.8 fL (ref 78.0–100.0)
Monocytes Absolute: 0.5 10*3/uL (ref 0.1–1.0)
Monocytes Relative: 5 %
NEUTROS PCT: 79 %
Neutro Abs: 6.9 10*3/uL (ref 1.7–7.7)
PLATELETS: 199 10*3/uL (ref 150–400)
RBC: 4.43 MIL/uL (ref 3.87–5.11)
RDW: 13.1 % (ref 11.5–15.5)
WBC: 8.8 10*3/uL (ref 4.0–10.5)

## 2015-09-03 LAB — COMPREHENSIVE METABOLIC PANEL
ALK PHOS: 76 U/L (ref 38–126)
ALT: 24 U/L (ref 14–54)
AST: 31 U/L (ref 15–41)
Albumin: 3.7 g/dL (ref 3.5–5.0)
Anion gap: 12 (ref 5–15)
BUN: 15 mg/dL (ref 6–20)
CALCIUM: 9 mg/dL (ref 8.9–10.3)
CHLORIDE: 102 mmol/L (ref 101–111)
CO2: 21 mmol/L — AB (ref 22–32)
CREATININE: 0.82 mg/dL (ref 0.44–1.00)
GFR calc Af Amer: >60 mL/min (ref >60)
GFR calc non Af Amer: >60 mL/min (ref >60)
GLUCOSE: 215 mg/dL — AB (ref 65–99)
Potassium: 4.3 mmol/L (ref 3.5–5.1)
SODIUM: 135 mmol/L (ref 135–145)
Total Bilirubin: 0.6 mg/dL (ref 0.3–1.2)
Total Protein: 7.8 g/dL (ref 6.5–8.1)

## 2015-09-03 LAB — I-STAT TROPONIN, ED: Troponin i, poc: 0.07 ng/mL (ref 0.00–0.08)

## 2015-09-03 LAB — URINALYSIS, ROUTINE W REFLEX MICROSCOPIC
BILIRUBIN URINE: NEGATIVE
GLUCOSE, UA: 500 mg/dL — AB
KETONES UR: NEGATIVE mg/dL
Leukocytes, UA: NEGATIVE
Nitrite: POSITIVE — AB
PH: 5 (ref 5.0–8.0)
Protein, ur: 100 mg/dL — AB
SPECIFIC GRAVITY, URINE: 1.023 (ref 1.005–1.030)

## 2015-09-03 LAB — URINE MICROSCOPIC-ADD ON

## 2015-09-03 LAB — LIPASE, BLOOD: Lipase: 31 U/L (ref 11–51)

## 2015-09-03 LAB — MAGNESIUM: Magnesium: 1.6 mg/dL — ABNORMAL LOW (ref 1.7–2.4)

## 2015-09-03 LAB — I-STAT CG4 LACTIC ACID, ED: LACTIC ACID, VENOUS: 3.5 mmol/L — AB (ref 0.5–2.0)

## 2015-09-03 MED ORDER — SODIUM CHLORIDE 0.9 % IV BOLUS (SEPSIS)
1000.0000 mL | Freq: Once | INTRAVENOUS | Status: AC
  Administered 2015-09-04: 1000 mL via INTRAVENOUS

## 2015-09-03 MED ORDER — MAGNESIUM SULFATE 2 GM/50ML IV SOLN
2.0000 g | Freq: Once | INTRAVENOUS | Status: AC
  Administered 2015-09-04: 2 g via INTRAVENOUS
  Filled 2015-09-03: qty 50

## 2015-09-03 MED ORDER — DEXTROSE 5 % IV SOLN
500.0000 mg | Freq: Once | INTRAVENOUS | Status: AC
  Administered 2015-09-03: 500 mg via INTRAVENOUS
  Filled 2015-09-03: qty 500

## 2015-09-03 MED ORDER — SODIUM CHLORIDE 0.9 % IV BOLUS (SEPSIS)
1000.0000 mL | Freq: Once | INTRAVENOUS | Status: AC
  Administered 2015-09-03: 1000 mL via INTRAVENOUS

## 2015-09-03 MED ORDER — SODIUM CHLORIDE 0.9 % IV BOLUS (SEPSIS): 1000.0000 mL | Freq: Once | INTRAVENOUS | Status: DC

## 2015-09-03 MED ORDER — METOPROLOL TARTRATE 5 MG/5ML IV SOLN
5.0000 mg | Freq: Once | INTRAVENOUS | Status: DC
  Filled 2015-09-03: qty 5

## 2015-09-03 MED ORDER — DEXTROSE 5 % IV SOLN
1.0000 g | Freq: Once | INTRAVENOUS | Status: AC
  Administered 2015-09-03: 1 g via INTRAVENOUS
  Filled 2015-09-03: qty 10

## 2015-09-03 NOTE — ED Notes (Signed)
Main lab called to draw labs, RN unsuccessful. 

## 2015-09-03 NOTE — Progress Notes (Signed)
[facility]  EDCM spoke to patient's family members at bedside.  Patient's family member reports Dr. Kriste BasqueNadel at [facility] is [facility]'s pcp.  System updated.

## 2015-09-03 NOTE — ED Notes (Signed)
Attempt to start IV unsuccessful. Schlossman reports will attempt US assist IV.

## 2015-09-03 NOTE — ED Notes (Signed)
Pt children pt normally disoriented x4 and nonverbal but may make occasional comment like "stop that." New onset nonproductive cough and congestion two days ago with one episode vomit and diarrhea today.

## 2015-09-03 NOTE — ED Provider Notes (Signed)
CSN: [id number]     Arrival date & time 09/03/15  1751 History   First MD Initiated Contact with Patient 09/03/15 1839     Chief Complaint  Patient presents with  . Nasal Congestion  . Emesis     (Consider location/radiation/quality/duration/timing/severity/associated sxs/prior Treatment) HPI  82 year old female with a history of Alzheimer's dementia, hypertension, coronary artery disease, hyperlipidemia, diabetes, remote atrial fibrillation presents with concern for altered mental status, cough, vomiting and diarrhea. Family reports that patient normally will follow some commands, however and has been very sleepy over the last 2-3 days. She is DNR/DNI.  Cough nonproductive, but sounds like she has something to raise and can't.  Has been sleepier than usual.  Emesisx1, diarrhea x4.  No known sick contacts.  Past Medical History  Diagnosis Date  . Unspecified essential hypertension   . CAD (coronary artery disease)   . Type II or unspecified type diabetes mellitus without mention of complication, not stated as uncontrolled   . Diaphragmatic hernia without mention of obstruction or gangrene   . Diverticulosis of colon (without mention of hemorrhage)   . Irritable bowel syndrome   . Unspecified disorder resulting from impaired renal function   . Osteoarthrosis, unspecified whether generalized or localized, unspecified site   . Unspecified transient cerebral ischemia   . Lumbago   . Headache(784.0)   . Senile dementia, uncomplicated   . Bell's palsy   . Anemia, unspecified    Past Surgical History  Procedure Laterality Date  . Cholecystectomy    . Hiatal hernia repair     Family History  Problem Relation Age of Onset  . Hypertension    . Diabetes    . Heart disease     Social History  Substance Use Topics  . Smoking status: Former Games developer  . Smokeless tobacco: None  . Alcohol Use: No   OB History    Gravida Para Term Preterm AB TAB SAB Ectopic Multiple Living   10          10     Review of Systems  Unable to perform ROS: Dementia      Allergies  Atorvastatin and Pioglitazone  Home Medications   Prior to Admission medications   Medication Sig Start Date End Date Taking? Authorizing Provider  amLODipine (NORVASC) 10 MG tablet TAKE 1/2 TABLET BY MOUTH ONCE DAILY Patient taking differently: TAKE 5 MG BY MOUTH ONCE DAILY 08/24/14  Yes Michele Mcalpine, MD  donepezil (ARICEPT) 10 MG tablet TAKE 1 TABLET BY MOUTH ONCE A DAY Patient taking differently: Take 10 mg by mouth daily.  03/07/13  Yes Michele Mcalpine, MD  donepezil (ARICEPT) 10 MG tablet TAKE 1 TABLET BY MOUTH ONCE A DAY 08/06/14  Yes Michele Mcalpine, MD  labetalol (NORMODYNE) 200 MG tablet TAKE 1 TABLET BY MOUTH TWICE A DAY Patient taking differently: Take 200 mg by mouth 2 (two) times daily.  03/07/13  Yes Michele Mcalpine, MD  memantine (NAMENDA) 10 MG tablet Take 10 mg by mouth 2 (two) times daily.   Yes Historical Provider, MD  metFORMIN (GLUCOPHAGE) 500 MG tablet Take 250 mg by mouth daily with breakfast.   Yes Historical Provider, MD  potassium chloride (K-DUR) 10 MEQ tablet TAKE 2 TABLETS BY MOUTH EVERY DAY Patient taking differently: TAKE 20 MEQ BY MOUTH EVERY DAY 10/21/14  Yes Michele Mcalpine, MD  traMADol (ULTRAM) 50 MG tablet TAKE 1 TABLET three times  A DAY AS NEEDED FOR ARTHRITIS PAIN Patient taking  differently: Take 50 mg by mouth 3 (three) times daily as needed for moderate pain or severe pain.  05/17/15  Yes Michele McalpineScott M Nadel, MD  oxyCODONE-acetaminophen (ROXICET) [phone] MG/5ML solution Take 5 mLs by mouth every 6 (six) hours as needed for severe pain. Patient not taking: Reported on 09/03/2015 09/02/15   Michele McalpineScott M Nadel, MD   BP 128/109 mmHg  Pulse 132  Temp(Src) 98.6 F (37 C) (Oral)  Resp 35  SpO2 100% Physical Exam  Constitutional: She appears well-developed and well-nourished. She appears listless. No distress.  HENT:  Head: Normocephalic and atraumatic.  Eyes: Conjunctivae and EOM are normal.   Neck: Normal range of motion.  Cardiovascular: Normal heart sounds and intact distal pulses.  An irregularly irregular rhythm present. Tachycardia present.  Exam reveals no gallop and no friction rub.   No murmur heard. Pulmonary/Chest: Effort normal. No respiratory distress. She has no wheezes. She has rhonchi. She has no rales.  Abdominal: Soft. She exhibits no distension. There is no tenderness. There is no guarding.  Musculoskeletal: She exhibits no edema or tenderness.  Neurological: She appears listless. GCS eye subscore is 2. GCS verbal subscore is 1. GCS motor subscore is 5.  Skin: Skin is warm and dry. No rash noted. She is not diaphoretic. No erythema.  Nursing note and vitals reviewed.   ED Course  Procedures (including critical care time) Labs Review Labs Reviewed  COMPREHENSIVE METABOLIC PANEL - Abnormal; Notable for the following:    CO2 21 (*)    Glucose, Bld 215 (*)    All other components within normal limits  URINALYSIS, ROUTINE W REFLEX MICROSCOPIC (NOT AT [facility]) - Abnormal; Notable for the following:    APPearance CLOUDY (*)    Glucose, UA 500 (*)    Hgb urine dipstick MODERATE (*)    Protein, ur 100 (*)    Nitrite POSITIVE (*)    All other components within normal limits  MAGNESIUM - Abnormal; Notable for the following:    Magnesium 1.6 (*)    All other components within normal limits  URINE MICROSCOPIC-ADD ON - Abnormal; Notable for the following:    Squamous Epithelial / LPF 0-5 (*)    Bacteria, UA MANY (*)    Casts HYALINE CASTS (*)    All other components within normal limits  I-STAT CG4 LACTIC ACID, ED - Abnormal; Notable for the following:    Lactic Acid, Venous 3.50 (*)    All other components within normal limits  URINE CULTURE  CULTURE, BLOOD (ROUTINE X 2)  CULTURE, BLOOD (ROUTINE X 2)  CBC WITH DIFFERENTIAL/PLATELET  LIPASE, BLOOD  PROCALCITONIN  APTT  HEMOGLOBIN A1C  CBC  LACTIC ACID, PLASMA  LACTIC ACID, PLASMA  PROTIME-INR  I-STAT  TROPOININ, ED  I-STAT CG4 LACTIC ACID, ED  I-STAT TROPOININ, ED  Rosezena SensorI-STAT TROPOININ, ED    Imaging Review Dg Chest 2 View  09/03/2015  CLINICAL DATA:  Cough and chest congestion for 2 days. Nausea, vomiting, and diarrhea beginning today. EXAM: CHEST  2 VIEW COMPARISON:  04/21/2008 FINDINGS: Mild cardiomegaly and ectasia of the thoracic aorta remains stable. Both lungs are well aerated and clear. No evidence of pneumothorax or pleural effusion. IMPRESSION: Stable cardiomegaly.  No active lung disease. Electronically Signed   By: Myles RosenthalJohn  Stahl M.D.   On: 09/03/2015 19:30   Ct Head Wo Contrast  09/03/2015  CLINICAL DATA:  82 year old female with altered mental status. EXAM: CT HEAD WITHOUT CONTRAST TECHNIQUE: Contiguous axial images were obtained from the  base of the skull through the vertex without intravenous contrast. COMPARISON:  CT dated 04/21/2008 FINDINGS: There is moderate-to-severe age-related atrophy similar or slightly advanced compared to the prior study. Periventricular and deep white matter chronic microvascular ischemic changes noted. There is no acute intracranial hemorrhage. No mass effect or midline shift noted. The visualized paranasal sinuses and mastoid air cells are clear. The calvarium is intact. IMPRESSION: No acute intracranial hemorrhage. Advanced age-related atrophy and chronic microvascular ischemic disease. If symptoms persist and there are no contraindications, MRI may provide better evaluation if clinically indicated. Electronically Signed   By: Elgie Collard M.D.   On: 09/03/2015 20:24   I have personally reviewed and evaluated these images and lab results as part of my medical decision-making.   EKG Interpretation   Date/Time:  Friday Sep 03 2015 20:07:09 EDT Ventricular Rate:  125 PR Interval:    QRS Duration: 92 QT Interval:  300 QTC Calculation: 433 R Axis:   70 Text Interpretation:  Atrial fibrillation Probable anterior infarct, age  indeterminate Atrial  fibrillation is new since prior ECG Confirmed by  [facility] MD, Arcangel Minion ([id number]) on 09/03/2015 11:22:23 PM      Emergency Ultrasound:  US Guidance for needle guidance  Performed by Dr. Dalene Seltzer Indication: need IV access  Linear probe used in real-time to visualize location of needle entry through skin. Interpretation: placement in vein Image archived electronically.   MDM   Final diagnoses:  Sepsis, due to unspecified organism [facility])  Urinary tract infection without hematuria, site unspecified  Somnolence  Lactic acidosis  Atrial fibrillation, unspecified type [facility])   82 year old female with a history of Alzheimer's dementia, hypertension, coronary artery disease, hyperlipidemia, diabetes, remote atrial fibrillation presents with concern for altered mental status, cough, vomiting and diarrhea. Family reports that patient normally will follow some commands, however and has been very sleepy over the last 2-3 days. She is DNR/DNI.  Patient tachycardic and tachypnea, arrival to the emergency department, with history concerning for possible pneumonia.  Blood cx were ordered and rocephin/azithromycin ordered empirically. Will hold on further evaluation for PE, O2 normal. CT head WNL.   Patient was a difficult IV stick and unfortunately there was a delay in IV access and blood work.  Placed US guided IV which was initially working, however infiltrated.  Second US guided IV was placed and initial was removed. Consulted IV team for second IV. Pt with poor options for access, however do not feel central line for IV access would be appropriate given DNR status and normal blood pressures and a working IV.  CXR without pneumonia. Urinalysis concerning for uti.  Pt with atrial fibrillation, however rate improving with IV hydration and will hold on further rate control medications at this time. Initial lactic acid 3.5 and 30cc/kg IV fluids ordered.  Consulted hospitalist for admission for concern for sepsis  secondary to urinary source.   Alvira Monday, MD 09/04/15 [phone]

## 2015-09-03 NOTE — ED Notes (Signed)
IV access infiltrated, Rt arm swelling. IV team consult placed for new IV access.

## 2015-09-03 NOTE — ED Notes (Signed)
Pt here with n/v/d and chest congestion.  Congestion and cough x 2 days.  N/V/D started today.  Pt with alzheimers and does not respond to staff.  Total care pt.  No change.  Pt is very congested.  Unknown for fever.

## 2015-09-04 ENCOUNTER — Inpatient Hospital Stay (HOSPITAL_COMMUNITY): Payer: Medicare Other

## 2015-09-04 ENCOUNTER — Observation Stay (HOSPITAL_COMMUNITY): Payer: Medicare Other

## 2015-09-04 DIAGNOSIS — L89152 Pressure ulcer of sacral region, stage 2: Secondary | ICD-10-CM | POA: Diagnosis present

## 2015-09-04 DIAGNOSIS — I517 Cardiomegaly: Secondary | ICD-10-CM

## 2015-09-04 DIAGNOSIS — N390 Urinary tract infection, site not specified: Secondary | ICD-10-CM | POA: Diagnosis present

## 2015-09-04 DIAGNOSIS — A419 Sepsis, unspecified organism: Principal | ICD-10-CM | POA: Diagnosis present

## 2015-09-04 DIAGNOSIS — R400 Somnolence: Secondary | ICD-10-CM | POA: Diagnosis not present

## 2015-09-04 DIAGNOSIS — I2510 Atherosclerotic heart disease of native coronary artery without angina pectoris: Secondary | ICD-10-CM | POA: Diagnosis present

## 2015-09-04 DIAGNOSIS — L8990 Pressure ulcer of unspecified site, unspecified stage: Secondary | ICD-10-CM | POA: Diagnosis not present

## 2015-09-04 DIAGNOSIS — R0602 Shortness of breath: Secondary | ICD-10-CM | POA: Diagnosis not present

## 2015-09-04 DIAGNOSIS — J189 Pneumonia, unspecified organism: Secondary | ICD-10-CM | POA: Diagnosis not present

## 2015-09-04 DIAGNOSIS — Z515 Encounter for palliative care: Secondary | ICD-10-CM | POA: Diagnosis not present

## 2015-09-04 DIAGNOSIS — Z7189 Other specified counseling: Secondary | ICD-10-CM

## 2015-09-04 DIAGNOSIS — E785 Hyperlipidemia, unspecified: Secondary | ICD-10-CM | POA: Diagnosis present

## 2015-09-04 DIAGNOSIS — Z87891 Personal history of nicotine dependence: Secondary | ICD-10-CM | POA: Diagnosis not present

## 2015-09-04 DIAGNOSIS — I4891 Unspecified atrial fibrillation: Secondary | ICD-10-CM | POA: Diagnosis present

## 2015-09-04 DIAGNOSIS — Z7984 Long term (current) use of oral hypoglycemic drugs: Secondary | ICD-10-CM | POA: Diagnosis not present

## 2015-09-04 DIAGNOSIS — R6520 Severe sepsis without septic shock: Secondary | ICD-10-CM | POA: Diagnosis not present

## 2015-09-04 DIAGNOSIS — Z66 Do not resuscitate: Secondary | ICD-10-CM | POA: Diagnosis present

## 2015-09-04 DIAGNOSIS — E872 Acidosis: Secondary | ICD-10-CM | POA: Diagnosis present

## 2015-09-04 DIAGNOSIS — D649 Anemia, unspecified: Secondary | ICD-10-CM

## 2015-09-04 DIAGNOSIS — F0390 Unspecified dementia without behavioral disturbance: Secondary | ICD-10-CM | POA: Diagnosis not present

## 2015-09-04 DIAGNOSIS — J9601 Acute respiratory failure with hypoxia: Secondary | ICD-10-CM | POA: Diagnosis present

## 2015-09-04 DIAGNOSIS — B9620 Unspecified Escherichia coli [E. coli] as the cause of diseases classified elsewhere: Secondary | ICD-10-CM | POA: Diagnosis present

## 2015-09-04 DIAGNOSIS — E8770 Fluid overload, unspecified: Secondary | ICD-10-CM | POA: Diagnosis present

## 2015-09-04 DIAGNOSIS — Z7401 Bed confinement status: Secondary | ICD-10-CM | POA: Diagnosis not present

## 2015-09-04 DIAGNOSIS — K449 Diaphragmatic hernia without obstruction or gangrene: Secondary | ICD-10-CM | POA: Diagnosis present

## 2015-09-04 DIAGNOSIS — J690 Pneumonitis due to inhalation of food and vomit: Secondary | ICD-10-CM | POA: Diagnosis present

## 2015-09-04 DIAGNOSIS — Z8673 Personal history of transient ischemic attack (TIA), and cerebral infarction without residual deficits: Secondary | ICD-10-CM | POA: Diagnosis not present

## 2015-09-04 DIAGNOSIS — E118 Type 2 diabetes mellitus with unspecified complications: Secondary | ICD-10-CM

## 2015-09-04 DIAGNOSIS — G9341 Metabolic encephalopathy: Secondary | ICD-10-CM | POA: Diagnosis present

## 2015-09-04 DIAGNOSIS — I10 Essential (primary) hypertension: Secondary | ICD-10-CM

## 2015-09-04 DIAGNOSIS — R112 Nausea with vomiting, unspecified: Secondary | ICD-10-CM | POA: Diagnosis present

## 2015-09-04 LAB — GLUCOSE, CAPILLARY
GLUCOSE-CAPILLARY: 270 mg/dL — AB (ref 65–99)
Glucose-Capillary: 163 mg/dL — ABNORMAL HIGH (ref 65–99)
Glucose-Capillary: 101 mg/dL — ABNORMAL HIGH (ref 65–99)

## 2015-09-04 LAB — MAGNESIUM: MAGNESIUM: 2 mg/dL (ref 1.7–2.4)

## 2015-09-04 LAB — ECHOCARDIOGRAM LIMITED
Height: 60 in
Weight: 2426.82 oz

## 2015-09-04 LAB — CBC
HCT: 34.9 % — ABNORMAL LOW (ref 36.0–46.0)
Hemoglobin: 11.3 g/dL — ABNORMAL LOW (ref 12.0–15.0)
MCH: 29.9 pg (ref 26.0–34.0)
MCHC: 32.4 g/dL (ref 30.0–36.0)
MCV: 92.3 fL (ref 78.0–100.0)
PLATELETS: 170 10*3/uL (ref 150–400)
RBC: 3.78 MIL/uL — ABNORMAL LOW (ref 3.87–5.11)
RDW: 13.4 % (ref 11.5–15.5)
WBC: 6.9 10*3/uL (ref 4.0–10.5)

## 2015-09-04 LAB — LACTIC ACID, PLASMA: LACTIC ACID, VENOUS: 1.6 mmol/L (ref 0.5–2.0)

## 2015-09-04 LAB — MRSA PCR SCREENING: MRSA BY PCR: NEGATIVE

## 2015-09-04 MED ORDER — LORAZEPAM 2 MG/ML IJ SOLN: 0.5000 mg | INTRAMUSCULAR | Status: DC | PRN

## 2015-09-04 MED ORDER — VANCOMYCIN HCL 500 MG IV SOLR
500.0000 mg | Freq: Two times a day (BID) | INTRAVENOUS | Status: DC
  Administered 2015-09-05 (×2): 500 mg via INTRAVENOUS
  Filled 2015-09-04 (×2): qty 500

## 2015-09-04 MED ORDER — DEXTROSE 5 % IV SOLN
500.0000 mg | INTRAVENOUS | Status: DC
  Administered 2015-09-04 – 2015-09-07 (×4): 500 mg via INTRAVENOUS
  Filled 2015-09-04 (×5): qty 500

## 2015-09-04 MED ORDER — AMLODIPINE BESYLATE 5 MG PO TABS
5.0000 mg | ORAL_TABLET | Freq: Every day | ORAL | Status: DC
Start: 2015-09-04 — End: 2015-09-08
  Administered 2015-09-08: 5 mg via ORAL
  Filled 2015-09-04 (×2): qty 1

## 2015-09-04 MED ORDER — ACETAMINOPHEN 325 MG PO TABS: 650.0000 mg | ORAL_TABLET | Freq: Four times a day (QID) | ORAL | Status: DC | PRN

## 2015-09-04 MED ORDER — DONEPEZIL HCL 5 MG PO TABS: 10.0000 mg | ORAL_TABLET | Freq: Every day | ORAL | Status: DC

## 2015-09-04 MED ORDER — MORPHINE SULFATE (PF) 2 MG/ML IV SOLN: 1.0000 mg | INTRAVENOUS | Status: DC | PRN

## 2015-09-04 MED ORDER — HALOPERIDOL LACTATE 5 MG/ML IJ SOLN
1.0000 mg | Freq: Four times a day (QID) | INTRAMUSCULAR | Status: DC | PRN
Start: 2015-09-04 — End: 2015-09-08

## 2015-09-04 MED ORDER — TRAMADOL HCL 50 MG PO TABS: 50.0000 mg | ORAL_TABLET | Freq: Three times a day (TID) | ORAL | Status: DC | PRN

## 2015-09-04 MED ORDER — LEVALBUTEROL HCL 0.63 MG/3ML IN NEBU
0.6300 mg | INHALATION_SOLUTION | Freq: Three times a day (TID) | RESPIRATORY_TRACT | Status: DC
  Administered 2015-09-04 – 2015-09-08 (×12): 0.63 mg via RESPIRATORY_TRACT
  Filled 2015-09-04 (×12): qty 3

## 2015-09-04 MED ORDER — DILTIAZEM HCL 25 MG/5ML IV SOLN: 5.0000 mg | INTRAVENOUS | Status: DC | PRN

## 2015-09-04 MED ORDER — LEVALBUTEROL HCL 0.63 MG/3ML IN NEBU
0.6300 mg | INHALATION_SOLUTION | Freq: Four times a day (QID) | RESPIRATORY_TRACT | Status: DC
  Administered 2015-09-04: 0.63 mg via RESPIRATORY_TRACT
  Filled 2015-09-04: qty 3

## 2015-09-04 MED ORDER — FUROSEMIDE 10 MG/ML IJ SOLN
40.0000 mg | INTRAMUSCULAR | Status: AC
  Administered 2015-09-04: 40 mg via INTRAVENOUS
  Filled 2015-09-04: qty 4

## 2015-09-04 MED ORDER — INSULIN ASPART 100 UNIT/ML ~~LOC~~ SOLN
0.0000 [IU] | Freq: Four times a day (QID) | SUBCUTANEOUS | Status: DC
  Administered 2015-09-04: 2 [IU] via SUBCUTANEOUS
  Administered 2015-09-04: 5 [IU] via SUBCUTANEOUS
  Administered 2015-09-05: 2 [IU] via SUBCUTANEOUS
  Administered 2015-09-05: 1 [IU] via SUBCUTANEOUS
  Administered 2015-09-06: 2 [IU] via SUBCUTANEOUS
  Administered 2015-09-07: 1 [IU] via SUBCUTANEOUS
  Administered 2015-09-07: 0 [IU] via SUBCUTANEOUS
  Administered 2015-09-07 – 2015-09-08 (×2): 2 [IU] via SUBCUTANEOUS
  Administered 2015-09-08: 1 [IU] via SUBCUTANEOUS

## 2015-09-04 MED ORDER — CEFTRIAXONE SODIUM 1 G IJ SOLR
1.0000 g | INTRAMUSCULAR | Status: DC
  Administered 2015-09-04 – 2015-09-07 (×4): 1 g via INTRAVENOUS
  Filled 2015-09-04 (×5): qty 10

## 2015-09-04 MED ORDER — LEVALBUTEROL HCL 0.63 MG/3ML IN NEBU
0.6300 mg | INHALATION_SOLUTION | Freq: Four times a day (QID) | RESPIRATORY_TRACT | Status: DC | PRN
  Administered 2015-09-04: 0.63 mg via RESPIRATORY_TRACT
  Filled 2015-09-04: qty 3

## 2015-09-04 MED ORDER — FUROSEMIDE 10 MG/ML IJ SOLN
40.0000 mg | Freq: Two times a day (BID) | INTRAMUSCULAR | Status: DC
  Administered 2015-09-04 (×2): 40 mg via INTRAVENOUS
  Filled 2015-09-04 (×2): qty 4

## 2015-09-04 MED ORDER — IPRATROPIUM BROMIDE 0.02 % IN SOLN
0.5000 mg | Freq: Three times a day (TID) | RESPIRATORY_TRACT | Status: DC
  Administered 2015-09-04 – 2015-09-08 (×12): 0.5 mg via RESPIRATORY_TRACT
  Filled 2015-09-04 (×12): qty 2.5

## 2015-09-04 MED ORDER — ONDANSETRON HCL 4 MG/2ML IJ SOLN: 4.0000 mg | Freq: Four times a day (QID) | INTRAMUSCULAR | Status: DC | PRN

## 2015-09-04 MED ORDER — POTASSIUM CHLORIDE ER 10 MEQ PO TBCR
20.0000 meq | EXTENDED_RELEASE_TABLET | Freq: Every day | ORAL | Status: DC
  Filled 2015-09-04 (×3): qty 2

## 2015-09-04 MED ORDER — LABETALOL HCL 100 MG PO TABS
200.0000 mg | ORAL_TABLET | Freq: Two times a day (BID) | ORAL | Status: DC
  Administered 2015-09-07 – 2015-09-08 (×2): 200 mg via ORAL
  Filled 2015-09-04: qty 2
  Filled 2015-09-04: qty 1
  Filled 2015-09-04: qty 2

## 2015-09-04 MED ORDER — ONDANSETRON HCL 4 MG PO TABS: 4.0000 mg | ORAL_TABLET | Freq: Four times a day (QID) | ORAL | Status: DC | PRN

## 2015-09-04 MED ORDER — ENOXAPARIN SODIUM 40 MG/0.4ML ~~LOC~~ SOLN
40.0000 mg | SUBCUTANEOUS | Status: DC
  Administered 2015-09-04 – 2015-09-08 (×5): 40 mg via SUBCUTANEOUS
  Filled 2015-09-04 (×5): qty 0.4

## 2015-09-04 MED ORDER — ACETAMINOPHEN 650 MG RE SUPP
650.0000 mg | Freq: Four times a day (QID) | RECTAL | Status: DC | PRN
  Administered 2015-09-05 (×2): 650 mg via RECTAL
  Filled 2015-09-04 (×2): qty 1

## 2015-09-04 MED ORDER — LEVALBUTEROL HCL 0.63 MG/3ML IN NEBU
0.6300 mg | INHALATION_SOLUTION | RESPIRATORY_TRACT | Status: AC
Start: 2015-09-04 — End: 2015-09-05

## 2015-09-04 MED ORDER — MEMANTINE HCL 10 MG PO TABS
10.0000 mg | ORAL_TABLET | Freq: Two times a day (BID) | ORAL | Status: DC
  Administered 2015-09-07 – 2015-09-08 (×2): 10 mg via ORAL
  Filled 2015-09-04: qty 2
  Filled 2015-09-04 (×10): qty 1

## 2015-09-04 MED ORDER — ATROPINE SULFATE 1 % OP SOLN
2.0000 [drp] | Freq: Four times a day (QID) | OPHTHALMIC | Status: DC | PRN
  Administered 2015-09-04 – 2015-09-06 (×3): 2 [drp] via SUBLINGUAL
  Filled 2015-09-04: qty 2

## 2015-09-04 MED ORDER — IPRATROPIUM BROMIDE 0.02 % IN SOLN
0.5000 mg | Freq: Four times a day (QID) | RESPIRATORY_TRACT | Status: DC
  Administered 2015-09-04: 0.5 mg via RESPIRATORY_TRACT
  Filled 2015-09-04: qty 2.5

## 2015-09-04 MED ORDER — LABETALOL HCL 5 MG/ML IV SOLN
10.0000 mg | INTRAVENOUS | Status: DC | PRN
  Administered 2015-09-04 – 2015-09-06 (×5): 10 mg via INTRAVENOUS
  Filled 2015-09-04 (×6): qty 4

## 2015-09-04 MED ORDER — HYDRALAZINE HCL 20 MG/ML IJ SOLN: 5.0000 mg | INTRAMUSCULAR | Status: DC | PRN

## 2015-09-04 MED ORDER — DONEPEZIL HCL 10 MG PO TABS
10.0000 mg | ORAL_TABLET | Freq: Every day | ORAL | Status: DC
  Administered 2015-09-08: 10 mg via ORAL
  Filled 2015-09-04 (×2): qty 1

## 2015-09-04 NOTE — Progress Notes (Signed)
SLP Cancellation Note  Patient Details Name: Sonya Price T Granito MRN: [id number] DOB: 02/21/1925   Cancelled treatment:       Reason Eval/Treat Not Completed: Fatigue/lethargy limiting ability to participate.  Will f/u next date for readiness. D/W Fredda Hammedn.    [name] [name] 09/04/2015, 1:00 PM

## 2015-09-04 NOTE — H&P (Addendum)
History and Physical    Sonya Price WNU:[id number] DOB: [birth year] DOA: 09/03/2015  Referring MD/NP/PA: Dr. Alvira Monday PCP: Michele Mcalpine, MD  Patient coming from: Home  Chief Complaint: Congestion and vomiting  HPI: Sonya Price is a 82 y.o. female with medical history significant of CAD, type 2 diabetes, dementia, diverticulosis, anemia, and nonambulatory; who presents with complaints of congestion and vomiting. Family including son, 2 daughters, and granddaughter provides history as patient is unable to do so secondary to dementia and lethargic. At baseline patient is nonambulatory and requires total care including feeding by one of her daughters during the week and then the son on the weekends. For the last 3 days the patient has not been doing well. She's had a change in her breathing and sounds more congested. She has what sounds like a productive cough, but family notes that she likely swallows secretions. Family denies any recent sick contacts to the knowledge. At baseline she is not on oxygen at home and has not received any recent antibiotics or been hospitalized.  Symptoms appear to worsen today with patient developing emesis and a having very loose bowel movement. Associated symptoms include increased lethargy although baseline patient sleeps a lot, but was not responding like normal.They say that she's had a history of atrial fibrillation that was diagnosed 20 years ago, but was never placed on any oral anticoagulants. Family states that the patient has a DO NOT RESUSCITATE order.   ED Course: Upon admission into the emergency department patient was evaluated and seen to be afebrile, with heart rates up to 132, respirations up to 35, and blood pressures of 170/107, O2 saturations maintained on nasal cannula O2. Initial blood work was relatively unremarkable except for elevated lactic acid 3.5, CO2 21, glucose 215. CT of the brain showed no acute abnormalities. Chest x-ray showed  cardiomegaly, but no signs of acute infiltrate or edema. Patient was started on azithromycin and Rocephin for clinical history. Urinalysis also seen to be positive for many bacteria and nitrates. Patient was pancultured and [facility] called to admit.   Review of Systems:Unable to obtain except for history of provider per family secondary to patient's dementia and lethargy.  Past Medical History  Diagnosis Date  . Unspecified essential hypertension   . CAD (coronary artery disease)   . Type II or unspecified type diabetes mellitus without mention of complication, not stated as uncontrolled   . Diaphragmatic hernia without mention of obstruction or gangrene   . Diverticulosis of colon (without mention of hemorrhage)   . Irritable bowel syndrome   . Unspecified disorder resulting from impaired renal function   . Osteoarthrosis, unspecified whether generalized or localized, unspecified site   . Unspecified transient cerebral ischemia   . Lumbago   . Headache(784.0)   . Senile dementia, uncomplicated   . Bell's palsy   . Anemia, unspecified     Past Surgical History  Procedure Laterality Date  . Cholecystectomy    . Hiatal hernia repair       reports that she has quit smoking. She does not have any smokeless tobacco history on file. She reports that she does not drink alcohol or use illicit drugs.  Allergies  Allergen Reactions  . Atorvastatin     REACTION: increased myalgia/CPK  . Pioglitazone     REACTION: swelling    Family History  Problem Relation Age of Onset  . Hypertension    . Diabetes    . Heart disease  Prior to Admission medications   Medication Sig Start Date End Date Taking? Authorizing Provider  amLODipine (NORVASC) 10 MG tablet TAKE 1/2 TABLET BY MOUTH ONCE DAILY Patient taking differently: TAKE 5 MG BY MOUTH ONCE DAILY 08/24/14  Yes Michele McalpineScott M Nadel, MD  donepezil (ARICEPT) 10 MG tablet TAKE 1 TABLET BY MOUTH ONCE A DAY Patient taking  differently: Take 10 mg by mouth daily.  03/07/13  Yes Michele McalpineScott M Nadel, MD  donepezil (ARICEPT) 10 MG tablet TAKE 1 TABLET BY MOUTH ONCE A DAY 08/06/14  Yes Michele McalpineScott M Nadel, MD  labetalol (NORMODYNE) 200 MG tablet TAKE 1 TABLET BY MOUTH TWICE A DAY Patient taking differently: Take 200 mg by mouth 2 (two) times daily.  03/07/13  Yes Michele McalpineScott M Nadel, MD  memantine (NAMENDA) 10 MG tablet Take 10 mg by mouth 2 (two) times daily.   Yes Historical Provider, MD  metFORMIN (GLUCOPHAGE) 500 MG tablet Take 250 mg by mouth daily with breakfast.   Yes Historical Provider, MD  potassium chloride (K-DUR) 10 MEQ tablet TAKE 2 TABLETS BY MOUTH EVERY DAY Patient taking differently: TAKE 20 MEQ BY MOUTH EVERY DAY 10/21/14  Yes Michele McalpineScott M Nadel, MD  traMADol (ULTRAM) 50 MG tablet TAKE 1 TABLET three times  A DAY AS NEEDED FOR ARTHRITIS PAIN Patient taking differently: Take 50 mg by mouth 3 (three) times daily as needed for moderate pain or severe pain.  05/17/15  Yes Michele McalpineScott M Nadel, MD  oxyCODONE-acetaminophen (ROXICET) [phone] MG/5ML solution Take 5 mLs by mouth every 6 (six) hours as needed for severe pain. Patient not taking: Reported on 09/03/2015 09/02/15   Michele McalpineScott M Nadel, MD    Physical Exam: Filed Vitals:   09/03/15 1817 09/03/15 1846 09/03/15 2230 09/03/15 2300  BP: 141/80 146/90 170/107 128/109  Pulse: 106 109 119 132  Temp:  98.6 F (37 C)    TempSrc:  Oral    Resp: 32 30 28 35  SpO2: 91% 92% 98% 100%      Constitutional:Obese elderly female in who appears very lethargic, no easily arousable.  Filed Vitals:   09/03/15 1817 09/03/15 1846 09/03/15 2230 09/03/15 2300  BP: 141/80 146/90 170/107 128/109  Pulse: 106 109 119 132  Temp:  98.6 F (37 C)    TempSrc:  Oral    Resp: 32 30 28 35  SpO2: 91% 92% 98% 100%   Eyes: PERRL, lids and conjunctivae normal ENMT: Mucous membranes are moist. Posterior pharynx clear of any exudate or lesions.Normal dentition.  Neck:Normal, supple, can hear upper airway gurgling  with breathing no signs of stridor  Respiratory:Tachypneic with possible rhonchi appreciated  Cardiovascular: Irregular irregular heart rhythm.  No rubs / gallops. No extremity edema. 2+ pedal pulses. No carotid bruits.  Abdomen: no tenderness, no masses palpated. No hepatosplenomegaly. Bowel sounds positive.  Musculoskeletal: no clubbing / cyanosis. No joint deformity upper and lower extremities. Good ROM, no contractures. Normal muscle tone.  Skin: Decubitus pressure ulcer of the sacrum noted  Neurologic: CN 2-12 grossly intact. Patient able to move all extremities with strength 4/5  Psychiatric:Dementia and currently lethargic unable to fully awake    Labs on Admission: I have personally reviewed following labs and imaging studies  CBC:  Recent Labs Lab 09/03/15 2136  WBC 8.8  NEUTROABS 6.9  HGB 13.5  HCT 39.8  MCV 89.8  PLT 199   Basic Metabolic Panel:  Recent Labs Lab 09/03/15 2136 09/03/15 2138  NA 135  --   K 4.3  --  CL 102  --   CO2 21*  --   GLUCOSE 215*  --   BUN 15  --   CREATININE 0.82  --   CALCIUM 9.0  --   MG  --  1.6*   GFR: CrCl cannot be calculated (Unknown ideal weight.). Liver Function Tests:  Recent Labs Lab 09/03/15 2136  AST 31  ALT 24  ALKPHOS 76  BILITOT 0.6  PROT 7.8  ALBUMIN 3.7    Recent Labs Lab 09/03/15 2136  LIPASE 31   No results for input(s): AMMONIA in the last 168 hours. Coagulation Profile: No results for input(s): INR, PROTIME in the last 168 hours. Cardiac Enzymes: No results for input(s): CKTOTAL, CKMB, CKMBINDEX, TROPONINI in the last 168 hours. BNP (last 3 results) No results for input(s): PROBNP in the last 8760 hours. HbA1C: No results for input(s): HGBA1C in the last 72 hours. CBG: No results for input(s): GLUCAP in the last 168 hours. Lipid Profile: No results for input(s): CHOL, HDL, LDLCALC, TRIG, CHOLHDL, LDLDIRECT in the last 72 hours. Thyroid Function Tests: No results for input(s): TSH,  T4TOTAL, FREET4, T3FREE, THYROIDAB in the last 72 hours. Anemia Panel: No results for input(s): VITAMINB12, FOLATE, FERRITIN, TIBC, IRON, RETICCTPCT in the last 72 hours. Urine analysis:    Component Value Date/Time   COLORURINE YELLOW 09/03/2015 2319   APPEARANCEUR CLOUDY* 09/03/2015 2319   LABSPEC 1.023 09/03/2015 2319   PHURINE 5.0 09/03/2015 2319   GLUCOSEU 500* 09/03/2015 2319   GLUCOSEU 100 mg/dL* 16/01/9603 5409   HGBUR MODERATE* 09/03/2015 2319   BILIRUBINUR NEGATIVE 09/03/2015 2319   KETONESUR NEGATIVE 09/03/2015 2319   PROTEINUR 100* 09/03/2015 2319   UROBILINOGEN 1.0 04/06/2011 1500   NITRITE POSITIVE* 09/03/2015 2319   LEUKOCYTESUR NEGATIVE 09/03/2015 2319   Sepsis Labs: @LABRCNTIP (procalcitonin:4,lacticidven:4) )No results found for this or any previous visit (from the past 240 hour(s)).   Radiological Exams on Admission: Dg Chest 2 View  09/03/2015  CLINICAL DATA:  Cough and chest congestion for 2 days. Nausea, vomiting, and diarrhea beginning today. EXAM: CHEST  2 VIEW COMPARISON:  04/21/2008 FINDINGS: Mild cardiomegaly and ectasia of the thoracic aorta remains stable. Both lungs are well aerated and clear. No evidence of pneumothorax or pleural effusion. IMPRESSION: Stable cardiomegaly.  No active lung disease. Electronically Signed   By: Myles Rosenthal M.D.   On: 09/03/2015 19:30   Ct Head Wo Contrast  09/03/2015  CLINICAL DATA:  82 year old female with altered mental status. EXAM: CT HEAD WITHOUT CONTRAST TECHNIQUE: Contiguous axial images were obtained from the base of the skull through the vertex without intravenous contrast. COMPARISON:  CT dated 04/21/2008 FINDINGS: There is moderate-to-severe age-related atrophy similar or slightly advanced compared to the prior study. Periventricular and deep white matter chronic microvascular ischemic changes noted. There is no acute intracranial hemorrhage. No mass effect or midline shift noted. The visualized paranasal sinuses and  mastoid air cells are clear. The calvarium is intact. IMPRESSION: No acute intracranial hemorrhage. Advanced age-related atrophy and chronic microvascular ischemic disease. If symptoms persist and there are no contraindications, MRI may provide better evaluation if clinically indicated. Electronically Signed   By: Elgie Collard M.D.   On: 09/03/2015 20:24    EKG: Independently reviewed. Atrial fibrillation  Assessment/Plan Sepsis with unclear source suspect urinary tract infection versus community-acquired pneumonia: Patient with reported cough, congestion, decreased mentation. Patient found to have many bacteria on UA positive nitrites. Patient was started on sepsis protocol after being found to have elevated lactate  acid of 3.5 with tachycardia and tachypnea. Chest x-ray was otherwise clear, but showed signs of cardiomegaly. She received 3 L of fluid in the ED along with antibiotics of azithromycin and ceftriaxone. - Admit to telemetry changed to stepdown secondary to hypoxia - Panculturing follow-up results - Trend lactic acid levels - Respiratory therapy to try to suction   - Continue azithromycin and ceftriaxone, de-escalate when able - Levalbuterol /Ipratropium shortness of breath or wheezing - mucinex  Acute respiratory failure with hypoxia secondary to fluid overload: Patient likely received too much fluid at once. O2 sats dropped to 70s necessitating a nonrebreather mask. - Continuous pulse oximetry, keep O2 sats greater than 92% - Lasix 40 mg 1 dose stat - Place foley  aggressive fluid  -  echocardiogram in a.m.  Acute encephalopathy with increased lethargy: Suspect that this could be secondary to acute infection. - Continue to monitor  - Placed Dobbhoff tube to administer medications for now - Speech therapy to evaluate and treat   Atrial fibrillation: chadvasc score 5 not currently on any oral anticoagulants - Continue rate control with labetalol  - Labetalol IV  Prn for  now  Essential hypertension: Uncontrolled currently, but question cuff size - Continue amlodipine, labetalol   Diabetes mellitus type 2 - Check hemoglobin A1c  - held metformin - CBGs every before meals and at bedtime   Dementia - Continue Namenda and Aricept - Care order for patient be fed meals  Hypomagnesemia: Patient's magnesium level I.6 on admission . Patient given magnesium sulfate in the ED.  - Recheck magnesium level in a.m.   Decubitus pressure ulcer - Low air mattress  - Consult wound care   DVT prophylaxis: Lovenox Code Status: DO NOT RESUSCITATE family Communication: Discussed plan of care with the to the patient's daughters, granddaughter, and son present at bedside Disposition Plan:  Likely discharge home if patient returns to baseline in 2-3 days  Consults called: None on status: Inpatient stepdown  Clydie Braun MD [facility] Pager [phone]  If 7PM-7AM, please contact night-coverage [email or web address] Password [email or web address]  09/04/2015, 12:50 AM

## 2015-09-04 NOTE — Progress Notes (Signed)
PROGRESS NOTE  Sonya Price  ZOX:[id number] DOB: 05/02/1924 DOA: 09/03/2015 PCP: Michele Mcalpine, MD Outpatient Specialists:  Subjective: Appears to be sleepy did not talk back to me, seen with her daughter at bedside  Brief Narrative:  82 year old African-American female with DM 2, dementia, CAD and bedridden status came into the hospital because of lethargy, congestion and questionable aspiration.  Assessment & Plan:   Principal Problem:   Sepsis (HCC) Active Problems:   ANEMIA   SENILE DEMENTIA   Essential hypertension   Diabetes mellitus type 2, controlled, with complications (HCC)   Atrial fibrillation (HCC)   Decubitus ulcer   Patient seen earlier today by my colleague Dr. Katrinka Blazing. This is a no charge note. I have seen and examined the patient, data base reviewed. Complex history including CAD, diabetes mellitus type 2, dementia and bedridden status. Came in with congestion, developed acute hypoxic respiratory failure in the hospital. Provide Lasix, antibiotics, she is DNR/DNI, discussed with her daughter at bedside, palliative care to evaluate.   DVT prophylaxis:  Code Status: DNR Family Communication:  Disposition Plan:  Diet: Diet heart healthy/carb modified Room service appropriate?: Yes; Fluid consistency:: Thin  Consultants:   None  Procedures:   None  Antimicrobials:   None  Objective: Filed Vitals:   09/04/15 0600 09/04/15 0630 09/04/15 0700 09/04/15 0818  BP: 164/127 141/89 165/88   Pulse: 120 128 107 116  Temp:   100.9 F (38.3 C)   TempSrc:   Oral   Resp: Height:      Weight:      SpO2: 100% 99% 100% 100%    Intake/Output Summary (Last 24 hours) at 09/04/15 0929 Last data filed at 09/04/15 0600  Gross per 24 hour  Intake      0 ml  Output    800 ml  Net   -800 ml   Filed Weights   09/04/15 0400  Weight: 68.8 kg (151 lb 10.8 oz)    Examination: General exam: Appears calm and comfortable  Respiratory system: Clear to  auscultation. Respiratory effort normal. Cardiovascular system: S1 & S2 heard, RRR. No JVD, murmurs, rubs, gallops or clicks. No pedal edema. Gastrointestinal system: Abdomen is nondistended, soft and nontender. No organomegaly or masses felt. Normal bowel sounds heard. Central nervous system: Alert and oriented. No focal neurological deficits. Extremities: Symmetric 5 x 5 power. Skin: No rashes, lesions or ulcers Psychiatry: Judgement and insight appear normal. Mood & affect appropriate.   Data Reviewed: I have personally reviewed following labs and imaging studies  CBC:  Recent Labs Lab 09/03/15 2136 09/04/15 0340  WBC 8.8 6.9  NEUTROABS 6.9  --   HGB 13.5 11.3*  HCT 39.8 34.9*  MCV 89.8 92.3  PLT 199 170   Basic Metabolic Panel:  Recent Labs Lab 09/03/15 2136 09/03/15 2138 09/04/15 0340  NA 135  --   --   K 4.3  --   --   CL 102  --   --   CO2 21*  --   --   GLUCOSE 215*  --   --   BUN 15  --   --   CREATININE 0.82  --   --   CALCIUM 9.0  --   --   MG  --  1.6* 2.0   GFR: Estimated Creatinine Clearance: 40.2 mL/min (by C-G formula based on Cr of 0.82). Liver Function Tests:  Recent Labs Lab 09/03/15 2136  AST 31  ALT 24  ALKPHOS 76  BILITOT 0.6  PROT 7.8  ALBUMIN 3.7    Recent Labs Lab 09/03/15 2136  LIPASE 31   No results for input(s): AMMONIA in the last 168 hours. Coagulation Profile: No results for input(s): INR, PROTIME in the last 168 hours. Cardiac Enzymes: No results for input(s): CKTOTAL, CKMB, CKMBINDEX, TROPONINI in the last 168 hours. BNP (last 3 results) No results for input(s): PROBNP in the last 8760 hours. HbA1C: No results for input(s): HGBA1C in the last 72 hours. CBG:  Recent Labs Lab 09/04/15 0622  GLUCAP 270*   Lipid Profile: No results for input(s): CHOL, HDL, LDLCALC, TRIG, CHOLHDL, LDLDIRECT in the last 72 hours. Thyroid Function Tests: No results for input(s): TSH, T4TOTAL, FREET4, T3FREE, THYROIDAB in the last  72 hours. Anemia Panel: No results for input(s): VITAMINB12, FOLATE, FERRITIN, TIBC, IRON, RETICCTPCT in the last 72 hours. Urine analysis:    Component Value Date/Time   COLORURINE YELLOW 09/03/2015 2319   APPEARANCEUR CLOUDY* 09/03/2015 2319   LABSPEC 1.023 09/03/2015 2319   PHURINE 5.0 09/03/2015 2319   GLUCOSEU 500* 09/03/2015 2319   GLUCOSEU 100 mg/dL* 16/01/9603 5409   HGBUR MODERATE* 09/03/2015 2319   BILIRUBINUR NEGATIVE 09/03/2015 2319   KETONESUR NEGATIVE 09/03/2015 2319   PROTEINUR 100* 09/03/2015 2319   UROBILINOGEN 1.0 04/06/2011 1500   NITRITE POSITIVE* 09/03/2015 2319   LEUKOCYTESUR NEGATIVE 09/03/2015 2319   Sepsis Labs: @LABRCNTIP (procalcitonin:4,lacticidven:4)  ) Recent Results (from the past 240 hour(s))  MRSA PCR Screening     Status: None   Collection Time: 09/04/15  4:11 AM  Result Value Ref Range Status   MRSA by PCR NEGATIVE NEGATIVE Final    Comment:        The GeneXpert MRSA Assay (FDA approved for NASAL specimens only), is one component of a comprehensive MRSA colonization surveillance program. It is not intended to diagnose MRSA infection nor to guide or monitor treatment for MRSA infections.      Invalid input(s): PROCALCITONIN, LACTICACIDVEN   Radiology Studies: Dg Chest 2 View  09/03/2015  CLINICAL DATA:  Cough and chest congestion for 2 days. Nausea, vomiting, and diarrhea beginning today. EXAM: CHEST  2 VIEW COMPARISON:  04/21/2008 FINDINGS: Mild cardiomegaly and ectasia of the thoracic aorta remains stable. Both lungs are well aerated and clear. No evidence of pneumothorax or pleural effusion. IMPRESSION: Stable cardiomegaly.  No active lung disease. Electronically Signed   By: Myles Rosenthal M.D.   On: 09/03/2015 19:30   Ct Head Wo Contrast  09/03/2015  CLINICAL DATA:  82 year old female with altered mental status. EXAM: CT HEAD WITHOUT CONTRAST TECHNIQUE: Contiguous axial images were obtained from the base of the skull through the  vertex without intravenous contrast. COMPARISON:  CT dated 04/21/2008 FINDINGS: There is moderate-to-severe age-related atrophy similar or slightly advanced compared to the prior study. Periventricular and deep white matter chronic microvascular ischemic changes noted. There is no acute intracranial hemorrhage. No mass effect or midline shift noted. The visualized paranasal sinuses and mastoid air cells are clear. The calvarium is intact. IMPRESSION: No acute intracranial hemorrhage. Advanced age-related atrophy and chronic microvascular ischemic disease. If symptoms persist and there are no contraindications, MRI may provide better evaluation if clinically indicated. Electronically Signed   By: Elgie Collard M.D.   On: 09/03/2015 20:24        Scheduled Meds: . amLODipine  5 mg Oral Daily  . azithromycin  500 mg Intravenous Q24H  . cefTRIAXone (ROCEPHIN)  IV  1 g Intravenous Q24H  .  donepezil  10 mg Oral Daily  . enoxaparin (LOVENOX) injection  40 mg Subcutaneous Q24H  . insulin aspart  0-9 Units Subcutaneous Q6H  . ipratropium  0.5 mg Nebulization TID  . labetalol  200 mg Oral BID  . levalbuterol  0.63 mg Nebulization STAT  . levalbuterol  0.63 mg Nebulization TID  . memantine  10 mg Oral BID  . potassium chloride  20 mEq Oral Daily  . sodium chloride  1,000 mL Intravenous Once   Continuous Infusions:    LOS: 0 days    Time spent: 35 minutes    Jayquon Theiler A, MD [facility] Pager [phone]  If 7PM-7AM, please contact night-coverage [email or web address] Password [email or web address] 09/04/2015, 9:29 AM

## 2015-09-04 NOTE — Consult Note (Signed)
Consultation Note Date: 09/04/2015   Patient Name: Sonya Price  DOB: 1924/12/15  MRN: [id number]  Age / Sex: 82 y.o., female  PCP: Noralee Space, MD Referring Physician: Verlee Monte, MD  Reason for Consultation: Establishing goals of care  HPI/Patient Profile: 82 y.o. female  with past medical history of coronary disease, type 2 diabetes, dementia, diverticulosis, anemia admitted on 09/03/2015 with congestion, vomiting, and lethargy. Her symptoms have worsened over the past few days the point now where she is largely somnolent. Palliative consulted for goals of care.   Clinical Assessment and Goals of Care: I met today with the patient's family including one of her sons, 2 of her daughters, a granddaughter, and another son via face time from [place].  They report the most important thing to her mother is being comfortable. They also report that her family and her faith have been essential to her in the past. She has 4 children involved in the ministry profession.  Family reports physicians have been doing a good job speaking with them. They understand that her condition is critical and there is high likelihood she will continue to decompensate and die this admission.  Family would like to continue with current therapies with no escalation of care in the event that she were to decompensate. Her son from [place] is not able to make it back to the [place] until Monday or Tuesday of this week. Their hope is that she will be able to hold on until he is able to come back to visit with her.  We also discussed symptom management. Currently Sonya Price appears very comfortable. I did discuss with family the addition of as needed medications in case she will require additional medications in order to remain comfortable.  SUMMARY OF RECOMMENDATIONS   - Patient is DO NOT RESUSCITATE/DO NOT INTUBATE with no escalation in  current care. - Her family will be traveling from out of town over the next few days. It is their hope that she will improve with current therapy and will live long enough for her son from [place] to travel to visit with her. - She appears very comfortable on my examination today. I did add on medications for use as needed in case she has additional needs to maintain her comfort moving forward. - Discussed with Dr. Hartford Poli  Code Status/Advance Care Planning:  DNR    Symptom Management:   Currently well controlled. I did make addition of as needed medicines in case they become necessary.  Pain/shortness of breath: Morphine as needed  Anxiety: Ativan as needed  Agitation: Haldol as needed  Palliative Prophylaxis:   Aspiration, Bowel Regimen, Delirium Protocol and Frequent Pain Assessment  Additional Recommendations (Limitations, Scope, Preferences):  Continue with current therapy. No escalation in care if her condition were to decompensate  Psycho-social/Spiritual:   Desire for further Chaplaincy support:no  Additional Recommendations: Grief/Bereavement Support  Prognosis:  Unable to determine due to acute illness. I do think there is high likelihood she will continue to decompensate and  her prognosis may be as short as hours to days   Discharge Planning: To Be Determined      Primary Diagnoses: Present on Admission:  . Sepsis ([place]) . ANEMIA . Diabetes mellitus type 2, controlled, with complications ([place]) . Essential hypertension . SENILE DEMENTIA . Atrial fibrillation ([place]) . Decubitus ulcer  I have reviewed the medical record, interviewed the patient and family, and examined the patient. The following aspects are pertinent.  Past Medical History  Diagnosis Date  . Unspecified essential hypertension   . CAD (coronary artery disease)   . Type II or unspecified type diabetes mellitus without mention of complication, not stated as uncontrolled   . Diaphragmatic  hernia without mention of obstruction or gangrene   . Diverticulosis of colon (without mention of hemorrhage)   . Irritable bowel syndrome   . Unspecified disorder resulting from impaired renal function   . Osteoarthrosis, unspecified whether generalized or localized, unspecified site   . Unspecified transient cerebral ischemia   . Lumbago   . Headache(784.0)   . Senile dementia, uncomplicated   . Bell's palsy   . Anemia, unspecified    Social History   Social History  . Marital Status: Single    Spouse Name: N/A  . Number of Children: 32  . Years of Education: N/A   Social History Main Topics  . Smoking status: Former Research scientist (life sciences)  . Smokeless tobacco: None  . Alcohol Use: No  . Drug Use: No  . Sexual Activity: No   Other Topics Concern  . None   Social History Narrative   Family History  Problem Relation Age of Onset  . Hypertension    . Diabetes    . Heart disease     Scheduled Meds: . amLODipine  5 mg Oral Daily  . azithromycin  500 mg Intravenous Q24H  . cefTRIAXone (ROCEPHIN)  IV  1 g Intravenous Q24H  . donepezil  10 mg Oral Daily  . enoxaparin (LOVENOX) injection  40 mg Subcutaneous Q24H  . furosemide  40 mg Intravenous BID  . insulin aspart  0-9 Units Subcutaneous Q6H  . ipratropium  0.5 mg Nebulization TID  . labetalol  200 mg Oral BID  . levalbuterol  0.63 mg Nebulization STAT  . levalbuterol  0.63 mg Nebulization TID  . memantine  10 mg Oral BID  . potassium chloride  20 mEq Oral Daily  . sodium chloride  1,000 mL Intravenous Once   Continuous Infusions:  PRN Meds:.acetaminophen **OR** acetaminophen, haloperidol lactate, hydrALAZINE, labetalol, LORazepam, morphine injection, ondansetron **OR** ondansetron (ZOFRAN) IV, traMADol Medications Prior to Admission:  Prior to Admission medications   Medication Sig Start Date End Date Taking? Authorizing Provider  amLODipine (NORVASC) 10 MG tablet TAKE 1/2 TABLET BY MOUTH ONCE DAILY Patient taking differently:  TAKE 5 MG BY MOUTH ONCE DAILY 08/24/14  Yes Noralee Space, MD  donepezil (ARICEPT) 10 MG tablet TAKE 1 TABLET BY MOUTH ONCE A DAY Patient taking differently: Take 10 mg by mouth daily.  03/07/13  Yes Noralee Space, MD  donepezil (ARICEPT) 10 MG tablet TAKE 1 TABLET BY MOUTH ONCE A DAY 08/06/14  Yes Noralee Space, MD  labetalol (NORMODYNE) 200 MG tablet TAKE 1 TABLET BY MOUTH TWICE A DAY Patient taking differently: Take 200 mg by mouth 2 (two) times daily.  03/07/13  Yes Noralee Space, MD  memantine (NAMENDA) 10 MG tablet Take 10 mg by mouth 2 (two) times daily.   Yes Historical Provider, MD  metFORMIN (GLUCOPHAGE) 500 MG tablet Take 250 mg by mouth daily with breakfast.   Yes Historical Provider, MD  potassium chloride (K-DUR) 10 MEQ tablet TAKE 2 TABLETS BY MOUTH EVERY DAY Patient taking differently: TAKE 20 MEQ BY MOUTH EVERY DAY 10/21/14  Yes Noralee Space, MD  traMADol (ULTRAM) 50 MG tablet TAKE 1 TABLET three times  A DAY AS NEEDED FOR ARTHRITIS PAIN Patient taking differently: Take 50 mg by mouth 3 (three) times daily as needed for moderate pain or severe pain.  05/17/15  Yes Noralee Space, MD  oxyCODONE-acetaminophen (ROXICET) [phone] MG/5ML solution Take 5 mLs by mouth every 6 (six) hours as needed for severe pain. Patient not taking: Reported on 09/03/2015 09/02/15   Noralee Space, MD   Allergies  Allergen Reactions  . Atorvastatin     REACTION: increased myalgia/CPK  . Pioglitazone     REACTION: swelling   Review of Systems Unable to obtain due to mental status  Physical Exam  General: Somnolent, in no acute distress. Does not arouse to verbal or tactile stimulation HEENT: No bruits, no goiter, no JVD Heart: Tachycardic. No murmur appreciated. Lungs: Fair air movement, scattered coarse breath sounds Abdomen: Soft, nontender, nondistended, positive bowel sounds.  Ext: No significant edema Skin: Warm and dry Neuro: Unable to assess   Vital Signs: BP 165/88 mmHg  Pulse 116   Temp(Src) 98.3 F (36.8 C) (Oral)  Resp 22  Ht 5' (1.524 m)  Wt 68.8 kg (151 lb 10.8 oz)  BMI 29.62 kg/m2  SpO2 100% Pain Assessment: PAINAD       SpO2: SpO2: 100 % O2 Device:SpO2: 100 % O2 Flow Rate: .O2 Flow Rate (L/min): 2 L/min  IO: Intake/output summary:  Intake/Output Summary (Last 24 hours) at 09/04/15 1444 Last data filed at 09/04/15 1100  Gross per 24 hour  Intake      0 ml  Output   1050 ml  Net  -1050 ml    LBM:   Baseline Weight: Weight: 68.8 kg (151 lb 10.8 oz) Most recent weight: Weight: 68.8 kg (151 lb 10.8 oz)     Palliative Assessment/Data: PPS 10%   Flowsheet Rows        Most Recent Value   Intake Tab    Referral Department  Hospitalist   Unit at Time of Referral  ICU   Palliative Care Primary Diagnosis  Sepsis/Infectious Disease   Date Notified  09/03/15   Palliative Care Type  New Palliative care   Date of Admission  09/04/15   Date first seen by Palliative Care  09/04/15   # of days Palliative referral response time  1 Day(s)   # of days IP prior to Palliative referral  -1   Clinical Assessment    Palliative Performance Scale Score  10%   Pain Max last 24 hours  Not able to report   Pain Min Last 24 hours  Not able to report   Psychosocial & Spiritual Assessment    Palliative Care Outcomes    Patient/Family meeting held?  Yes   Who was at the meeting?  4 of patient's children and 1 grandchild   Palliative Care Outcomes  Improved pain interventions, Improved non-pain symptom therapy, Clarified goals of care      Time In: 1305 Time Out: 1420 Time Total: 75 Greater than 50%  of this time was spent counseling and coordinating care related to the above assessment and plan.  Signed by: Micheline Rough, MD   Please  contact Palliative Medicine Team phone at [phone] for questions and concerns.  For individual provider: See Shea Evans

## 2015-09-04 NOTE — Progress Notes (Signed)
  Echocardiogram 2D Echocardiogram has been performed.  Leta JunglingCooper, Ayriana Wix M 09/04/2015, 10:50 AM

## 2015-09-05 LAB — BASIC METABOLIC PANEL
ANION GAP: 12 (ref 5–15)
BUN: 18 mg/dL (ref 6–20)
CALCIUM: 8.3 mg/dL — AB (ref 8.9–10.3)
CO2: 24 mmol/L (ref 22–32)
CREATININE: 1.12 mg/dL — AB (ref 0.44–1.00)
Chloride: 102 mmol/L (ref 101–111)
GFR, EST AFRICAN AMERICAN: 49 mL/min — AB (ref >60)
GFR, EST NON AFRICAN AMERICAN: 42 mL/min — AB (ref >60)
GLUCOSE: 120 mg/dL — AB (ref 65–99)
Potassium: 3.4 mmol/L — ABNORMAL LOW (ref 3.5–5.1)
Sodium: 138 mmol/L (ref 135–145)

## 2015-09-05 LAB — GLUCOSE, CAPILLARY
GLUCOSE-CAPILLARY: 110 mg/dL — AB (ref 65–99)
GLUCOSE-CAPILLARY: 166 mg/dL — AB (ref 65–99)
Glucose-Capillary: 124 mg/dL — ABNORMAL HIGH (ref 65–99)
Glucose-Capillary: 126 mg/dL — ABNORMAL HIGH (ref 65–99)
Glucose-Capillary: 116 mg/dL — ABNORMAL HIGH (ref 65–99)

## 2015-09-05 LAB — CULTURE, BLOOD (ROUTINE X 2)

## 2015-09-05 LAB — CBC
HCT: 34.2 % — ABNORMAL LOW (ref 36.0–46.0)
Hemoglobin: 11.4 g/dL — ABNORMAL LOW (ref 12.0–15.0)
MCH: 30 pg (ref 26.0–34.0)
MCHC: 33.3 g/dL (ref 30.0–36.0)
MCV: 90 fL (ref 78.0–100.0)
PLATELETS: 160 10*3/uL (ref 150–400)
RBC: 3.8 MIL/uL — ABNORMAL LOW (ref 3.87–5.11)
RDW: 13.3 % (ref 11.5–15.5)
WBC: 8.5 10*3/uL (ref 4.0–10.5)

## 2015-09-05 MED ORDER — VANCOMYCIN HCL IN DEXTROSE 750-5 MG/150ML-% IV SOLN
750.0000 mg | INTRAVENOUS | Status: DC
  Administered 2015-09-06: 750 mg via INTRAVENOUS
  Filled 2015-09-05: qty 150

## 2015-09-05 MED ORDER — METOPROLOL TARTRATE 5 MG/5ML IV SOLN
5.0000 mg | Freq: Once | INTRAVENOUS | Status: AC
  Administered 2015-09-05: 5 mg via INTRAVENOUS
  Filled 2015-09-05: qty 5

## 2015-09-05 NOTE — Progress Notes (Signed)
Pharmacy Antibiotic Note  Sonya Price is a 82 y.o. female admitted on 09/03/2015 with bacteremia.  Pharmacy has been consulted for Vancomycin dosing.  Plan: Vancomycin 500mg  IV every 12 hours.  Goal trough 15-20 mcg/mL.  Height: 5' (152.4 cm) Weight: 151 lb 10.8 oz (68.8 kg) IBW/kg (Calculated) : 45.5  Temp (24hrs), Avg:100.8 F (38.2 C), Min:98.3 F (36.8 C), Max:102.4 F (39.1 C)   Recent Labs Lab 09/03/15 2136 09/03/15 2148 09/04/15 0340 09/05/15 0336  WBC 8.8  --  6.9 8.5  CREATININE 0.82  --   --  1.12*  LATICACIDVEN  --  3.50* 1.6  --     Estimated Creatinine Clearance: 29.5 mL/min (by C-G formula based on Cr of 1.12).    Allergies  Allergen Reactions  . Atorvastatin     REACTION: increased myalgia/CPK  . Pioglitazone     REACTION: swelling    Antimicrobials this admission: Azithromycin 09/04/2015 >> Ceftriaxone 09/04/2015 >> Vancomycin 09/04/2015 >>  Dose adjustments this admission: -  Microbiology results: pending  Thank you for allowing pharmacy to be a part of this patient's care.  Aleene DavidsonGrimsley Jr, Dazia Lippold Crowford 09/05/2015 4:49 AM

## 2015-09-05 NOTE — Progress Notes (Signed)
PROGRESS NOTE  Sonya Price  ZOX:[id number] DOB: 24-Jan-1925 DOA: 09/03/2015 PCP: Michele Mcalpine, MD Outpatient Specialists:  Subjective: Lethargic, seen with her 2 daughters at bedside, continued to have fever overnight  Brief Narrative:  82 year old African-American female with DM 2, dementia, CAD and bedridden status came into the hospital because of lethargy, congestion and questionable aspiration.  Assessment & Plan:   Principal Problem:   Sepsis (HCC) Active Problems:   ANEMIA   SENILE DEMENTIA   Essential hypertension   Diabetes mellitus type 2, controlled, with complications (HCC)   Atrial fibrillation (HCC)   Decubitus ulcer   Sepsis with unclear source suspect urinary tract infection versus community-acquired pneumonia:  Patient with reported cough, congestion, decreased mentation. Patient found to have many bacteria on UA positive nitrites. Patient was started on sepsis protocol after being found to have elevated lactate acid of 3.5 with tachycardia and tachypnea. Chest x-ray was otherwise clear, but showed signs of cardiomegaly.  -On vancomycin, azithromycin and ceftriaxone, continue current antibiotics. -1/2 blood cultures showed GPC, cultures to follow.  Acute respiratory failure with hypoxia secondary to fluid overload:  Patient likely received too much fluid at once. O2 sats dropped to 70s necessitating a nonrebreather mask. Received Lasix yesterday, appears euvolemic today, hold Lasix, evaluate volume status in a.m.  Acute encephalopathy with increased lethargy:  Suspect that this could be secondary to acute infection. Continue to monitor closely.  Atrial fibrillation: chadvasc score 5 not currently on any oral anticoagulants - Continue rate control with labetalol  - Labetalol IV Prn for now  Essential hypertension: Uncontrolled currently, but question cuff size - Continue amlodipine, labetalol   Diabetes mellitus type 2 - Check hemoglobin A1c  - held  metformin - CBGs every before meals and at bedtime   Dementia - Continue Namenda and Aricept - Care order for patient be fed meals  Hypomagnesemia:  Patient's magnesium level 1.6 on admission, this is repleted with IV supplements.   Decubitus pressure ulcer likely stage II - Low air mattress  - Consult wound care    DVT prophylaxis: Lovenox Code Status: DNR Family Communication: Daughters at bedside. Disposition Plan: Stays in a stepdown Diet: Diet heart healthy/carb modified Room service appropriate?: Yes; Fluid consistency:: Thin  Consultants:   None  Procedures:   None  Antimicrobials:   None  Objective: Filed Vitals:   09/05/15 0400 09/05/15 0500 09/05/15 0630 09/05/15 0748  BP: 92/53 136/58  116/55  Pulse: 109 112  121  Temp: 101.1 F (38.4 C)  101.5 F (38.6 C)   TempSrc: Axillary  Axillary   Resp:  24  25  Height:      Weight:      SpO2: 99% 100%  100%    Intake/Output Summary (Last 24 hours) at 09/05/15 0948 Last data filed at 09/05/15 0600  Gross per 24 hour  Intake    400 ml  Output   1250 ml  Net   -850 ml   Filed Weights   09/04/15 0400  Weight: 68.8 kg (151 lb 10.8 oz)    Examination: General exam: Appears calm and comfortable  Respiratory system: Clear to auscultation. Respiratory effort normal. Cardiovascular system: S1 & S2 heard, RRR. No JVD, murmurs, rubs, gallops or clicks. No pedal edema. Gastrointestinal system: Abdomen is nondistended, soft and nontender. No organomegaly or masses felt. Normal bowel sounds heard. Central nervous system: Alert and oriented. No focal neurological deficits. Extremities: Symmetric 5 x 5 power. Skin: No rashes, lesions or ulcers Psychiatry: Judgement  and insight appear normal. Mood & affect appropriate.   Data Reviewed: I have personally reviewed following labs and imaging studies  CBC:  Recent Labs Lab 09/03/15 2136 09/04/15 0340 09/05/15 0336  WBC 8.8 6.9 8.5  NEUTROABS 6.9  --   --     HGB 13.5 11.3* 11.4*  HCT 39.8 34.9* 34.2*  MCV 89.8 92.3 90.0  PLT 199 170 160   Basic Metabolic Panel:  Recent Labs Lab 09/03/15 2136 09/03/15 2138 09/04/15 0340 09/05/15 0336  NA 135  --   --  138  K 4.3  --   --  3.4*  CL 102  --   --  102  CO2 21*  --   --  24  GLUCOSE 215*  --   --  120*  BUN 15  --   --  18  CREATININE 0.82  --   --  1.12*  CALCIUM 9.0  --   --  8.3*  MG  --  1.6* 2.0  --    GFR: Estimated Creatinine Clearance: 29.5 mL/min (by C-G formula based on Cr of 1.12). Liver Function Tests:  Recent Labs Lab 09/03/15 2136  AST 31  ALT 24  ALKPHOS 76  BILITOT 0.6  PROT 7.8  ALBUMIN 3.7    Recent Labs Lab 09/03/15 2136  LIPASE 31   No results for input(s): AMMONIA in the last 168 hours. Coagulation Profile: No results for input(s): INR, PROTIME in the last 168 hours. Cardiac Enzymes: No results for input(s): CKTOTAL, CKMB, CKMBINDEX, TROPONINI in the last 168 hours. BNP (last 3 results) No results for input(s): PROBNP in the last 8760 hours. HbA1C: No results for input(s): HGBA1C in the last 72 hours. CBG:  Recent Labs Lab 09/04/15 1152 09/04/15 1755 09/05/15 0004 09/05/15 0622 09/05/15 0744  GLUCAP 163* 101* 166* 110* 124*   Lipid Profile: No results for input(s): CHOL, HDL, LDLCALC, TRIG, CHOLHDL, LDLDIRECT in the last 72 hours. Thyroid Function Tests: No results for input(s): TSH, T4TOTAL, FREET4, T3FREE, THYROIDAB in the last 72 hours. Anemia Panel: No results for input(s): VITAMINB12, FOLATE, FERRITIN, TIBC, IRON, RETICCTPCT in the last 72 hours. Urine analysis:    Component Value Date/Time   COLORURINE YELLOW 09/03/2015 2319   APPEARANCEUR CLOUDY* 09/03/2015 2319   LABSPEC 1.023 09/03/2015 2319   PHURINE 5.0 09/03/2015 2319   GLUCOSEU 500* 09/03/2015 2319   GLUCOSEU 100 mg/dL* 16/10/960409/30/2009 [id number]   HGBUR MODERATE* 09/03/2015 2319   BILIRUBINUR NEGATIVE 09/03/2015 2319   KETONESUR NEGATIVE 09/03/2015 2319   PROTEINUR 100*  09/03/2015 2319   UROBILINOGEN 1.0 04/06/2011 1500   NITRITE POSITIVE* 09/03/2015 2319   LEUKOCYTESUR NEGATIVE 09/03/2015 2319   Sepsis Labs: @LABRCNTIP (procalcitonin:4,lacticidven:4)  ) Recent Results (from the past 240 hour(s))  Blood culture (routine x 2)     Status: None (Preliminary result)   Collection Time: 09/03/15  9:48 PM  Result Value Ref Range Status   Specimen Description BLOOD LEFT ARM  Final   Special Requests IN PEDIATRIC BOTTLE 1.5CC  Final   Culture  Setup Time   Final    ARPED GRAM POSITIVE COCCI IN CLUSTERS CRITICAL RESULT CALLED TO, READ BACK BY AND VERIFIED WITH: TO AKROLICZAK(RN0 BY [facility] 09/04/2015 AT 10:09AM Organism ID to follow [facility]    Culture PENDING  Incomplete   Report Status PENDING  Incomplete  Urine culture     Status: Abnormal (Preliminary result)   Collection Time: 09/03/15 11:19 PM  Result Value Ref Range  Status   Specimen Description URINE, CATHETERIZED  Final   Special Requests NONE  Final   Culture 50,000 COLONIES/mL ESCHERICHIA COLI (A)  Final   Report Status PENDING  Incomplete  MRSA PCR Screening     Status: None   Collection Time: 09/04/15  4:11 AM  Result Value Ref Range Status   MRSA by PCR NEGATIVE NEGATIVE Final    Comment:        The GeneXpert MRSA Assay (FDA approved for NASAL specimens only), is one component of a comprehensive MRSA colonization surveillance program. It is not intended to diagnose MRSA infection nor to guide or monitor treatment for MRSA infections.      Invalid input(s): PROCALCITONIN, LACTICACIDVEN   Radiology Studies: Dg Chest 2 View  09/04/2015  CLINICAL DATA:  82 year old female with history of shortness of breath. Diabetes. CAD. EXAM: CHEST  2 VIEW COMPARISON:  Chest x-ray 09/03/2015. FINDINGS: Interval worsening of multifocal airspace disease throughout the right lung a which appears to predominantly involve the right lower lobe (including the superior segment  of the right lower lobe), concerning for worsening pneumonia. Multiple air bronchograms are noted throughout this region. Left lung is clear. No pleural effusions. No evidence of pulmonary edema. Mild cardiomegaly. Upper mediastinal contours are distorted by patient's rotation the right. IMPRESSION: 1. Worsening right lower lobe pneumonia. Followup PA and lateral chest X-ray is recommended in 3-4 weeks following trial of antibiotic therapy to ensure resolution and exclude underlying malignancy. 2. Cardiomegaly. Electronically Signed   By: Trudie Reed M.D.   On: 09/04/2015 14:32   Dg Chest 2 View  09/03/2015  CLINICAL DATA:  Cough and chest congestion for 2 days. Nausea, vomiting, and diarrhea beginning today. EXAM: CHEST  2 VIEW COMPARISON:  04/21/2008 FINDINGS: Mild cardiomegaly and ectasia of the thoracic aorta remains stable. Both lungs are well aerated and clear. No evidence of pneumothorax or pleural effusion. IMPRESSION: Stable cardiomegaly.  No active lung disease. Electronically Signed   By: Myles Rosenthal M.D.   On: 09/03/2015 19:30   Ct Head Wo Contrast  09/03/2015  CLINICAL DATA:  82 year old female with altered mental status. EXAM: CT HEAD WITHOUT CONTRAST TECHNIQUE: Contiguous axial images were obtained from the base of the skull through the vertex without intravenous contrast. COMPARISON:  CT dated 04/21/2008 FINDINGS: There is moderate-to-severe age-related atrophy similar or slightly advanced compared to the prior study. Periventricular and deep white matter chronic microvascular ischemic changes noted. There is no acute intracranial hemorrhage. No mass effect or midline shift noted. The visualized paranasal sinuses and mastoid air cells are clear. The calvarium is intact. IMPRESSION: No acute intracranial hemorrhage. Advanced age-related atrophy and chronic microvascular ischemic disease. If symptoms persist and there are no contraindications, MRI may provide better evaluation if clinically  indicated. Electronically Signed   By: Elgie Collard M.D.   On: 09/03/2015 20:24        Scheduled Meds: . amLODipine  5 mg Oral Daily  . azithromycin  500 mg Intravenous Q24H  . cefTRIAXone (ROCEPHIN)  IV  1 g Intravenous Q24H  . donepezil  10 mg Oral Daily  . enoxaparin (LOVENOX) injection  40 mg Subcutaneous Q24H  . insulin aspart  0-9 Units Subcutaneous Q6H  . ipratropium  0.5 mg Nebulization TID  . labetalol  200 mg Oral BID  . levalbuterol  0.63 mg Nebulization TID  . memantine  10 mg Oral BID  . potassium chloride  20 mEq Oral Daily  . sodium chloride  1,000  mL Intravenous Once  . vancomycin  500 mg Intravenous BID   Continuous Infusions:    LOS: 1 day    Time spent: 35 minutes    Chizaram Latino A, MD [facility] Pager [phone]  If 7PM-7AM, please contact night-coverage [email or web address] Password [facility] 09/05/2015, 9:48 AM

## 2015-09-05 NOTE — Progress Notes (Signed)
Pharmacy Antibiotic Note  Sonya Price is a 82 y.o. female admitted on 09/03/2015 with possible bacteremia with 1 of 2 blood cultures positive with CNS, rule out sepsis with source UTI vs CAP.  Pharmacy has been consulted for Vancomycin dosing in addition to Zithromax/Rocephin already started. Note also with 50k Ecoli per urine culture - follow up results  Plan: 1) SCr rising - change vanc from 500mg  IV q12 to 750mg  q24 2) Check trough as appropriate  Height: 5' (152.4 cm) Weight: 151 lb 10.8 oz (68.8 kg) IBW/kg (Calculated) : 45.5  Temp (24hrs), Avg:100.9 F (38.3 C), Min:98.3 F (36.8 C), Max:102.4 F (39.1 C)   Recent Labs Lab 09/03/15 2136 09/03/15 2148 09/04/15 0340 09/05/15 0336  WBC 8.8  --  6.9 8.5  CREATININE 0.82  --   --  1.12*  LATICACIDVEN  --  3.50* 1.6  --     Estimated Creatinine Clearance: 29.5 mL/min (by C-G formula based on Cr of 1.12).    Allergies  Allergen Reactions  . Atorvastatin     REACTION: increased myalgia/CPK  . Pioglitazone     REACTION: swelling    Antimicrobials this admission: Azithromycin 09/04/2015 >> Ceftriaxone 09/04/2015 >> Vancomycin 09/04/2015 >>  Dose adjustments this admission: -  Microbiology results: 5/12 BCx: 1 of 2 CNS - ?contaminant 5/12 UCx: 50k Ecoli 5/13 MRSA PCR: neg   Thank you for allowing pharmacy to be a part of this patient's care.   Hessie KnowsJustin M Laisha Rau, PharmD, BCPS Pager [phone] 09/05/2015 11:09 AM

## 2015-09-06 DIAGNOSIS — R0602 Shortness of breath: Secondary | ICD-10-CM | POA: Insufficient documentation

## 2015-09-06 DIAGNOSIS — Z515 Encounter for palliative care: Secondary | ICD-10-CM | POA: Insufficient documentation

## 2015-09-06 DIAGNOSIS — R400 Somnolence: Secondary | ICD-10-CM | POA: Insufficient documentation

## 2015-09-06 LAB — GLUCOSE, CAPILLARY
GLUCOSE-CAPILLARY: 115 mg/dL — AB (ref 65–99)
GLUCOSE-CAPILLARY: 197 mg/dL — AB (ref 65–99)
GLUCOSE-CAPILLARY: 95 mg/dL (ref 65–99)

## 2015-09-06 LAB — CBC
HCT: 32.7 % — ABNORMAL LOW (ref 36.0–46.0)
Hemoglobin: 10.9 g/dL — ABNORMAL LOW (ref 12.0–15.0)
MCH: 30.2 pg (ref 26.0–34.0)
MCHC: 33.3 g/dL (ref 30.0–36.0)
MCV: 90.6 fL (ref 78.0–100.0)
Platelets: 181 10*3/uL (ref 150–400)
RBC: 3.61 MIL/uL — ABNORMAL LOW (ref 3.87–5.11)
RDW: 13.5 % (ref 11.5–15.5)
WBC: 6.9 10*3/uL (ref 4.0–10.5)

## 2015-09-06 LAB — URINE CULTURE

## 2015-09-06 LAB — BASIC METABOLIC PANEL
Anion gap: 8 (ref 5–15)
BUN: 22 mg/dL — AB (ref 6–20)
CO2: 25 mmol/L (ref 22–32)
CREATININE: 0.91 mg/dL (ref 0.44–1.00)
Calcium: 8.6 mg/dL — ABNORMAL LOW (ref 8.9–10.3)
Chloride: 104 mmol/L (ref 101–111)
GFR calc non Af Amer: 54 mL/min — ABNORMAL LOW (ref >60)
GLUCOSE: 90 mg/dL (ref 65–99)
Potassium: 3.1 mmol/L — ABNORMAL LOW (ref 3.5–5.1)
Sodium: 137 mmol/L (ref 135–145)

## 2015-09-06 MED ORDER — POTASSIUM CHLORIDE 10 MEQ/100ML IV SOLN
10.0000 meq | INTRAVENOUS | Status: AC
  Administered 2015-09-06 (×6): 10 meq via INTRAVENOUS
  Filled 2015-09-06 (×6): qty 100

## 2015-09-06 MED ORDER — METOPROLOL TARTRATE 5 MG/5ML IV SOLN
10.0000 mg | INTRAVENOUS | Status: DC
  Administered 2015-09-06 – 2015-09-08 (×11): 10 mg via INTRAVENOUS
  Filled 2015-09-06 (×11): qty 10

## 2015-09-06 MED ORDER — LIP MEDEX EX OINT
TOPICAL_OINTMENT | CUTANEOUS | Status: AC
  Administered 2015-09-06: 10:00:00
  Filled 2015-09-06: qty 7

## 2015-09-06 NOTE — Care Management Note (Signed)
Case Management Note  Patient Details  Name: Sonya Price MRN: [id number] Date of Birth: 04-Mar-1925  Subjective/Objective:           Hypotensive with positive bld cultures early sepsis         Action/Plan:Date:  Sep 06, 2015 Chart reviewed for concurrent status and case management needs. Will continue to follow patient for changes and needs: Expected discharge date: [id number] Marcelle SmilingRhonda Davis, BSN, [place]3, [place]   [phone]   Expected Discharge Date:  [id number]               Expected Discharge Plan:  Home/Self Care  In-House Referral:  Clinical Social Work  Discharge planning Services  CM Consult  Post Acute Care Choice:  NA Choice offered to:  NA  DME Arranged:  N/A DME Agency:  NA  HH Arranged:  NA HH Agency:  NA  Status of Service:  In process, will continue to follow  Medicare Important Message Given:    Date Medicare IM Given:    Medicare IM give by:    Date Additional Medicare IM Given:    Additional Medicare Important Message give by:     If discussed at Long Length of Stay Meetings, dates discussed:    Additional Comments:  Golda AcreDavis, Rhonda Lynn, RN 09/06/2015, 10:25 AM

## 2015-09-06 NOTE — Progress Notes (Signed)
PROGRESS NOTE  DARIN REDMANN  ZOX:[id number] DOB: 08-21-1924 DOA: 09/03/2015 PCP: Michele Mcalpine, MD Outpatient Specialists:  Subjective: Continues to be lethargic, last temperature spike was 101.5 at 6 AM yesterday.  Brief Narrative:  82 year old African-American female with DM 2, dementia, CAD and bedridden status came into the hospital because of lethargy, congestion and questionable aspiration.  Assessment & Plan:   Principal Problem:   Sepsis (HCC) Active Problems:   ANEMIA   SENILE DEMENTIA   Essential hypertension   Diabetes mellitus type 2, controlled, with complications (HCC)   Atrial fibrillation (HCC)   Decubitus ulcer   Sepsis with unclear source suspect urinary tract infection versus community-acquired pneumonia:  Patient with reported cough, congestion, decreased mentation. Patient found to have many bacteria on UA positive nitrites. Patient was started on sepsis protocol after being found to have elevated lactate acid of 3.5 with tachycardia and tachypnea. Chest x-ray was otherwise clear, but showed signs of cardiomegaly.  -On vancomycin, azithromycin and ceftriaxone, continue current antibiotics. -1/2 blood cultures showed GPC, cultures to follow.  Acute respiratory failure with hypoxia secondary to fluid overload:  Patient likely received too much fluid at once. O2 sats dropped to 70s necessitating a nonrebreather mask. Fluids status appears to be euvolemic, continue to hold Lasix.  Acute encephalopathy with increased lethargy:  Suspect that this could be secondary to acute infection. Continue to monitor closely.  Atrial fibrillation: chadvasc score 5 not currently on any oral anticoagulants - Continue rate control with labetalol  - Labetalol IV Prn for now  Essential hypertension: Uncontrolled currently, but question cuff size - Continue amlodipine, labetalol   Diabetes mellitus type 2 - Check hemoglobin A1c  - held metformin - CBGs every before meals  and at bedtime   Dementia - Continue Namenda and Aricept - Care order for patient be fed meals  Hypomagnesemia:  Patient's magnesium level 1.6 on admission, this is repleted with IV supplements.   Decubitus pressure ulcer likely stage II - Low air mattress  - Consult wound care    DVT prophylaxis: Lovenox Code Status: DNR Family Communication: Daughter at bedside. Disposition Plan: Stays in a stepdown Diet: Diet heart healthy/carb modified Room service appropriate?: Yes; Fluid consistency:: Thin  Consultants:   None  Procedures:   None  Antimicrobials:   None  Objective: Filed Vitals:   09/06/15 0600 09/06/15 0700 09/06/15 0800 09/06/15 0838  BP: 112/63 133/71 145/118   Pulse: 102 97 104 100  Temp:      TempSrc:      Resp: 26 32 24 20  Height:      Weight:      SpO2: 98% 99% 98% 100%    Intake/Output Summary (Last 24 hours) at 09/06/15 1103 Last data filed at 09/06/15 0803  Gross per 24 hour  Intake    450 ml  Output    370 ml  Net     80 ml   Filed Weights   09/04/15 0400  Weight: 68.8 kg (151 lb 10.8 oz)    Examination: General exam: Appears calm and comfortable  Respiratory system: Clear to auscultation. Respiratory effort normal. Cardiovascular system: S1 & S2 heard, RRR. No JVD, murmurs, rubs, gallops or clicks. No pedal edema. Gastrointestinal system: Abdomen is nondistended, soft and nontender. No organomegaly or masses felt. Normal bowel sounds heard. Central nervous system: Alert and oriented. No focal neurological deficits. Extremities: Symmetric 5 x 5 power. Skin: No rashes, lesions or ulcers Psychiatry: Judgement and insight appear normal. Mood &  affect appropriate.   Data Reviewed: I have personally reviewed following labs and imaging studies  CBC:  Recent Labs Lab 09/03/15 2136 09/04/15 0340 09/05/15 0336 09/06/15 0309  WBC 8.8 6.9 8.5 6.9  NEUTROABS 6.9  --   --   --   HGB 13.5 11.3* 11.4* 10.9*  HCT 39.8 34.9* 34.2*  32.7*  MCV 89.8 92.3 90.0 90.6  PLT 199 170 160 181   Basic Metabolic Panel:  Recent Labs Lab 09/03/15 2136 09/03/15 2138 09/04/15 0340 09/05/15 0336 09/06/15 0309  NA 135  --   --  138 137  K 4.3  --   --  3.4* 3.1*  CL 102  --   --  102 104  CO2 21*  --   --  24 25  GLUCOSE 215*  --   --  120* 90  BUN 15  --   --  18 22*  CREATININE 0.82  --   --  1.12* 0.91  CALCIUM 9.0  --   --  8.3* 8.6*  MG  --  1.6* 2.0  --   --    GFR: Estimated Creatinine Clearance: 36.3 mL/min (by C-G formula based on Cr of 0.91). Liver Function Tests:  Recent Labs Lab 09/03/15 2136  AST 31  ALT 24  ALKPHOS 76  BILITOT 0.6  PROT 7.8  ALBUMIN 3.7    Recent Labs Lab 09/03/15 2136  LIPASE 31   No results for input(s): AMMONIA in the last 168 hours. Coagulation Profile: No results for input(s): INR, PROTIME in the last 168 hours. Cardiac Enzymes: No results for input(s): CKTOTAL, CKMB, CKMBINDEX, TROPONINI in the last 168 hours. BNP (last 3 results) No results for input(s): PROBNP in the last 8760 hours. HbA1C: No results for input(s): HGBA1C in the last 72 hours. CBG:  Recent Labs Lab 09/05/15 0744 09/05/15 1201 09/05/15 1714 09/05/15 2343 09/06/15 0744  GLUCAP 124* 126* 116* 197* 95   Lipid Profile: No results for input(s): CHOL, HDL, LDLCALC, TRIG, CHOLHDL, LDLDIRECT in the last 72 hours. Thyroid Function Tests: No results for input(s): TSH, T4TOTAL, FREET4, T3FREE, THYROIDAB in the last 72 hours. Anemia Panel: No results for input(s): VITAMINB12, FOLATE, FERRITIN, TIBC, IRON, RETICCTPCT in the last 72 hours. Urine analysis:    Component Value Date/Time   COLORURINE YELLOW 09/03/2015 2319   APPEARANCEUR CLOUDY* 09/03/2015 2319   LABSPEC 1.023 09/03/2015 2319   PHURINE 5.0 09/03/2015 2319   GLUCOSEU 500* 09/03/2015 2319   GLUCOSEU 100 mg/dL* 16/01/9603 5409   HGBUR MODERATE* 09/03/2015 2319   BILIRUBINUR NEGATIVE 09/03/2015 2319   KETONESUR NEGATIVE 09/03/2015  2319   PROTEINUR 100* 09/03/2015 2319   UROBILINOGEN 1.0 04/06/2011 1500   NITRITE POSITIVE* 09/03/2015 2319   LEUKOCYTESUR NEGATIVE 09/03/2015 2319   Sepsis Labs: (procalcitonin:4,lacticidven:4)  ) Recent Results (from the past 240 hour(s))  Blood culture (routine x 2)     Status: Abnormal   Collection Time: 09/03/15  9:48 PM  Result Value Ref Range Status   Specimen Description BLOOD LEFT ARM  Final   Special Requests IN PEDIATRIC BOTTLE 1.5CC  Final   Culture  Setup Time   Final    ARPED GRAM POSITIVE COCCI IN CLUSTERS CRITICAL RESULT CALLED TO, READ BACK BY AND VERIFIED WITH: TO AKROLICZAK(RN0 BY [facility] 09/04/2015 AT 10:09AM Organism ID to follow    Culture (A)  Final    STAPHYLOCOCCUS SPECIES (COAGULASE NEGATIVE) THE SIGNIFICANCE OF ISOLATING THIS ORGANISM FROM A SINGLE SET OF BLOOD CULTURES WHEN  MULTIPLE SETS ARE DRAWN IS UNCERTAIN. PLEASE NOTIFY THE MICROBIOLOGY DEPARTMENT WITHIN ONE WEEK IF SPECIATION AND SENSITIVITIES ARE REQUIRED. [facility]    Report Status 09/05/2015 FINAL  Final  Blood culture (routine x 2)     Status: None (Preliminary result)   Collection Time: 09/03/15  9:50 PM  Result Value Ref Range Status   Specimen Description BLOOD LEFT HAND  Final   Special Requests BOTTLES DRAWN AEROBIC AND ANAEROBIC 5CC  Final   Culture   Final    NO GROWTH 1 DAY [facility]    Report Status PENDING  Incomplete  Urine culture     Status: Abnormal   Collection Time: 09/03/15 11:19 PM  Result Value Ref Range Status   Specimen Description URINE, CATHETERIZED  Final   Special Requests NONE  Final   Culture 50,000 COLONIES/mL ESCHERICHIA COLI (A)  Final   Report Status 09/06/2015 FINAL  Final   Organism ID, Bacteria ESCHERICHIA COLI (A)  Final      Susceptibility   Escherichia coli - MIC*    AMPICILLIN 8 SENSITIVE Sensitive     CEFAZOLIN <=4 SENSITIVE Sensitive     CEFTRIAXONE <=1 SENSITIVE Sensitive      CIPROFLOXACIN <=0.25 SENSITIVE Sensitive     GENTAMICIN <=1 SENSITIVE Sensitive     IMIPENEM <=0.25 SENSITIVE Sensitive     NITROFURANTOIN <=16 SENSITIVE Sensitive     TRIMETH/SULFA <=20 SENSITIVE Sensitive     AMPICILLIN/SULBACTAM 4 SENSITIVE Sensitive     PIP/TAZO <=4 SENSITIVE Sensitive     * 50,000 COLONIES/mL ESCHERICHIA COLI  MRSA PCR Screening     Status: None   Collection Time: 09/04/15  4:11 AM  Result Value Ref Range Status   MRSA by PCR NEGATIVE NEGATIVE Final    Comment:        The GeneXpert MRSA Assay (FDA approved for NASAL specimens only), is one component of a comprehensive MRSA colonization surveillance program. It is not intended to diagnose MRSA infection nor to guide or monitor treatment for MRSA infections.      Invalid input(s): PROCALCITONIN, LACTICACIDVEN   Radiology Studies: No results found.      Scheduled Meds: . amLODipine  5 mg Oral Daily  . azithromycin  500 mg Intravenous Q24H  . cefTRIAXone (ROCEPHIN)  IV  1 g Intravenous Q24H  . donepezil  10 mg Oral Daily  . enoxaparin (LOVENOX) injection  40 mg Subcutaneous Q24H  . insulin aspart  0-9 Units Subcutaneous Q6H  . ipratropium  0.5 mg Nebulization TID  . labetalol  200 mg Oral BID  . levalbuterol  0.63 mg Nebulization TID  . memantine  10 mg Oral BID  . potassium chloride  10 mEq Intravenous Q1 Hr x 6  . sodium chloride  1,000 mL Intravenous Once  . vancomycin  750 mg Intravenous Q24H   Continuous Infusions:    LOS: 2 days    Time spent: 35 minutes    Verma Grothaus A, MD [facility] Pager [phone]  If 7PM-7AM, please contact night-coverage [email or web address] Password [email or web address] 09/06/2015, 11:03 AM

## 2015-09-06 NOTE — Progress Notes (Signed)
Daily Progress Note   Patient Name: Sonya Price       Date: 09/06/2015 DOB: 12-22-1924  Age: 82 y.o. MRN#: [id number] Attending Physician: Verlee Monte, MD Primary Care Physician: Noralee Space, MD Admit Date: 09/03/2015  Reason for Consultation/Follow-up: Establishing goals of care  Subjective: Met today with Sonya Price son Ilona Sorrel.  He reports that Sonya Price is "largely the same" but had a period of increased alertness this morning where Sonya Price opened her eyes for a period of time. Still remains nonverbal and no other significant interaction with family.  Reports that his brother will be coming in from [place] tomorrow evening. He remains hopeful that his brother will be able to come to the hospital for his mother dies. They are agreeable to talk further on Wednesday (his brother will not be until late Tuesday evening) in order to discuss her condition and options moving forward based upon how Sonya Price does over the next 48 hours.  Length of Stay: 2  Current Medications: Scheduled Meds:  . amLODipine  5 mg Oral Daily  . azithromycin  500 mg Intravenous Q24H  . cefTRIAXone (ROCEPHIN)  IV  1 g Intravenous Q24H  . donepezil  10 mg Oral Daily  . enoxaparin (LOVENOX) injection  40 mg Subcutaneous Q24H  . insulin aspart  0-9 Units Subcutaneous Q6H  . ipratropium  0.5 mg Nebulization TID  . labetalol  200 mg Oral BID  . levalbuterol  0.63 mg Nebulization TID  . memantine  10 mg Oral BID  . metoprolol  10 mg Intravenous Q4H  . sodium chloride  1,000 mL Intravenous Once    Continuous Infusions:    PRN Meds: acetaminophen **OR** acetaminophen, atropine, haloperidol lactate, hydrALAZINE, labetalol, LORazepam, morphine injection, ondansetron **OR** ondansetron (ZOFRAN) IV, traMADol  Physical Exam           General: Somnolent, in no acute distress. Does not arouse to verbal or tactile stimulation HEENT: No bruits, no goiter, no JVD Heart: Tachycardic. No murmur appreciated. Lungs: Fair air movement, scattered coarse breath sounds Abdomen: Soft, nontender, nondistended, positive bowel sounds.  Ext: No significant edema Skin: Warm and dry Neuro: Unable to assess  Vital Signs: BP 123/78 mmHg  Pulse 94  Temp(Src) 98.8 F (37.1 C) (Axillary)  Resp 22  Ht 5' (1.524 m)  Wt 68.8 kg (151  lb 10.8 oz)  BMI 29.62 kg/m2  SpO2 100% SpO2: SpO2: 100 % O2 Device: O2 Device: Nasal Cannula O2 Flow Rate: O2 Flow Rate (L/min): 2 L/min  Intake/output summary:  Intake/Output Summary (Last 24 hours) at 09/06/15 1812 Last data filed at 09/06/15 8366  Gross per 24 hour  Intake    450 ml  Output    250 ml  Net    200 ml   LBM: Last BM Date: 09/04/15 Baseline Weight: Weight: 68.8 kg (151 lb 10.8 oz) Most recent weight: Weight: 68.8 kg (151 lb 10.8 oz)       Palliative Assessment/Data: PPS 10%   Flowsheet Rows        Most Recent Value   Intake Tab    Referral Department  Hospitalist   Unit at Time of Referral  ICU   Palliative Care Primary Diagnosis  Sepsis/Infectious Disease   Date Notified  09/03/15   Palliative Care Type  New Palliative care   Date of Admission  09/04/15   Date first seen by Palliative Care  09/04/15   # of days Palliative referral response time  1 Day(s)   # of days IP prior to Palliative referral  -1   Clinical Assessment    Palliative Performance Scale Score  10%   Pain Max last 24 hours  Not able to report   Pain Min Last 24 hours  Not able to report   Psychosocial & Spiritual Assessment    Palliative Care Outcomes    Patient/Family meeting held?  Yes   Who was at the meeting?  4 of patient's children and 1 grandchild   Palliative Care Outcomes  Improved pain interventions, Improved non-pain symptom therapy, Clarified goals of care      Patient Active  Problem List   Diagnosis Date Noted  . Sepsis ([place]) 09/04/2015  . Atrial fibrillation ([place]) 09/04/2015  . Decubitus ulcer 09/04/2015  . Diabetes mellitus type 2, controlled, with complications ([place]) 29/47/6546  . Postmenopausal bleeding 04/26/2011  . BELL'S PALSY, RIGHT 05/01/2008  . URINARY INCONTINENCE 01/03/2008  . SENILE DEMENTIA 07/16/2007  . Diverticulosis of large intestine 07/16/2007  . Disorder resulting from impaired renal function 07/16/2007  . HYPERCHOLESTEROLEMIA 03/13/2007  . ANEMIA 03/13/2007  . Essential hypertension 03/13/2007  . Coronary atherosclerosis 03/13/2007  . Transient cerebral ischemia 03/13/2007  . Diaphragmatic hernia 03/13/2007  . IRRITABLE BOWEL SYNDROME 03/13/2007  . Osteoarthritis 03/13/2007  . BACK PAIN, LUMBAR 03/13/2007  . SHINGLES, HX OF 03/13/2007    Palliative Care Assessment & Plan   Patient Profile: 82 year old African-American female with history of dementia, diabetes, coronary artery disease who is bedridden and present to the hospital with increased lethargy and sepsis likely secondary to suspected aspiration pneumonia  Recommendations/Plan: - Patient is DO NOT RESUSCITATE/DO NOT INTUBATE with no escalation in current care in the event Sonya Price decompensates. - Her son is traveling from [place] and will arrive late tomorrow evening. Family would like to continue with current therapy at this time in the hopes that he will arrive and be able to visit with her.  I do suspect there is high likelihood this will be a terminal admission. - Sonya Price appears very comfortable on my examination today. Sonya Price has medications for use as needed in case Sonya Price has additional needs to maintain her comfort moving forward.  Goals of Care and Additional Recommendations:  Limitations on Scope of Treatment: Continue current therapy no escalation of care Sonya Price were to decompensate  Code Status:  Code Status Orders        Start     Ordered   09/04/15 0058  Do not  attempt resuscitation (DNR)   Continuous    Question Answer Comment  In the event of cardiac or respiratory ARREST Do not call a "code blue"   In the event of cardiac or respiratory ARREST Do not perform Intubation, CPR, defibrillation or ACLS   In the event of cardiac or respiratory ARREST Use medication by any route, position, wound care, and other measures to relive pain and suffering. May use oxygen, suction and manual treatment of airway obstruction as needed for comfort.      09/04/15 0104    Code Status History    Date Active Date Inactive Code Status Order ID Comments User Context   This patient has a current code status but no historical code status.    Advance Directive Documentation        Most Recent Value   Type of Advance Directive  Healthcare Power of Attorney, Living will   Pre-existing out of facility DNR order (yellow form or pink MOST form)     "MOST" Form in Place?         Prognosis:  Unable to determine due to acute illness. I do think there is high likelihood Sonya Price will continue to decompensate and her prognosis may be as short as hours to days   Discharge Planning: To Be Determined   Care plan was discussed with Patient's son  Thank you for allowing the Palliative Medicine Team to assist in the care of this patient.   Time In: 1600 Time Out: 1620 Total Time 20 Prolonged Time Billed No      Greater than 50%  of this time was spent counseling and coordinating care related to the above assessment and plan.  Micheline Rough, MD  Please contact Palliative Medicine Team phone at [phone] for questions and concerns.

## 2015-09-06 NOTE — Progress Notes (Signed)
Informed Dr. Arthor CaptainElmahi about pt unable to take PO meds and K is 3.1.  See orders.  Erick Blinksuchman, Mattingly Fountaine D, RN

## 2015-09-06 NOTE — Consult Note (Signed)
WOC wound consult note Reason for Consult: Consult requested for sacrum/buttocks.  Family members at the bedside state that they care for her at home and usually apply barrier cream to the affected area, and "she previously had severe wounds which healed at some point, but sometimes a small place will re-open near her tailbone." Wound type: Pink dry scar tissue to lower inner bilat buttocks, and further scar tissue across bilat buttocks. There is some white macerated skin to the scar edges and a stage 2 pressure injury to the sacral area; .2X.2X.1cm, pink and moist. Family members at the bedside assessed the locations and state they are unchanged since she was at home. They are discussing comfort care goals, according to the EMR. Pressure Ulcer POA: Yes Dressing procedure/placement/frequency: Foam dressing to protect from further injury.  Discussed plan of care with family members and they verbalize understanding. Please re-consult if further assistance is needed.  Thank-you,  Cammie Mcgeeawn Kemet Nijjar MSN, RN, CWOCN, [place], CNS [phone]

## 2015-09-06 NOTE — Progress Notes (Signed)
Initial Nutrition Assessment  DOCUMENTATION CODES:   Not applicable  INTERVENTION:  - Diet advancement as medically feasible, within POC/GOC - RD will continue to monitor for needs  NUTRITION DIAGNOSIS:   Inadequate oral intake related to inability to eat as evidenced by NPO status.  GOAL:   Patient will meet greater than or equal to 90% of their needs  MONITOR:   Diet advancement, Weight trends, Labs, Skin, I & O's  REASON FOR ASSESSMENT:   Low Braden  ASSESSMENT:   82 y.o. female with medical history significant of CAD, type 2 diabetes, dementia, diverticulosis, anemia, and nonambulatory; who presents with complaints of congestion and vomiting. Family including son, 2 daughters, and granddaughter provides history as patient is unable to do so secondary to dementia and lethargic. At baseline patient is nonambulatory and requires total care including feeding by one of her daughters during the week and then the son on the weekends. For the last 3 days the patient has not been doing well. She's had a change in her breathing and sounds more congested. She has what sounds like a productive cough, but family notes that she likely swallows secretions. Family denies any recent sick contacts to the knowledge. At baseline she is not on oxygen at home and has not received any recent antibiotics or been hospitalized. Symptoms appear to worsen today with patient developing emesis and a having very loose bowel movement. Associated symptoms include increased lethargy although baseline patient sleeps a lot, but was not responding like normal.They say that she's had a history of atrial fibrillation that was diagnosed 20 years ago, but was never placed on any oral anticoagulants. Family states that the patient has a DO NOT RESUSCITATE order.   Pt seen for low Braden. BMI indicates overweight, borderline obesity. Diet order in place but son, who is at bedside and provides all information, states that  pt has been NPO and unable to have anything to eat since admission 09/03/15. Pt with hx of Alzheimer's dementia and is mainly nonverbal at baseline. Son at bedside takes care of pt on the weekends and feeds her during that time frame; pt is total care.   PTA pt had a very good appetite. She typically ate a large breakfast and dinner and would eat yogurt or applesauce as a snack in the afternoon. Pt drank thin liquids out of a sippy cup without issue. To assist with ease of chewing, foods are pulsed in a foods processor to reach ground or pureed consistency. Son reports that pt did very well with this. He states that a few days PTA pt was much more lethargic than usual and that she was having "gurggling" in her throat. He states that despite this pt was still eating well and did not show signs of struggle while trying to eat or drink.   Son reports that pt's weight has been stable with a 1-2 lb increase or decrease at times. Per chart review, pt has lost 14 lbs (8.5% body weight) in the past 6 months which is not significant for time frame; will continue to monitor weight trends during admission. Physical assessment shows no muscle or fat wasting at this time.   Pt unable to meet needs at this time; will monitor for needs as she is able to eat. Flow sheet indicates skin WDL but notes state pt with a stage 2 decubitus ulcer (no location noted) with wound care consult to occur. Medications reviewed; 10 mEq KCl x6 doses today. Labs reviewed; K:  3.1 mmol/L, BUN: 22 mg/dL, Ca: 8.6 mg/dL.   Diet Order:  Diet heart healthy/carb modified Room service appropriate?: Yes; Fluid consistency:: Thin  Skin:  Reviewed, no issues  Last BM:  5/13  Height:   Ht Readings from Last 1 Encounters:  09/04/15 5' (1.524 m)    Weight:   Wt Readings from Last 1 Encounters:  09/04/15 151 lb 10.8 oz (68.8 kg)    Ideal Body Weight:  45.45 kg (kg)  BMI:  Body mass index is 29.62 kg/(m^2).  Estimated Nutritional Needs:    Kcal:  1350-1550  Protein:  60-70 grams  Fluid:  >/= 1.6 L/day  EDUCATION NEEDS:   No education needs identified at this time     Trenton Gammon, RD, LDN Inpatient Clinical Dietitian Pager # [phone] After hours/weekend pager # [phone]

## 2015-09-06 NOTE — Evaluation (Signed)
SLP Cancellation Note  Patient Details Name: Sonya Price MRN: [id number] DOB: 10/03/1924   Cancelled treatment:       Reason Eval/Treat Not Completed: Fatigue/lethargy limiting ability to participate  RN reports pt slightly more alert than previously but mostly only moans.  Per RN, pt has not been able to consume po intake.  RN spoke to family member who reported pt to continue to be lethargic, observed pt to be sleeping.  Will continue efforts.   Sonya Burnetamara Zyier Dykema, MS [facility] SLP [phone]

## 2015-09-06 NOTE — Clinical Documentation Improvement (Signed)
Hospitalist  Please document query responses in the progress notes and discharge summary. Please do not document query responses on the CDI BPA in [facility]. Please do not deactivated query BPA's so other providers can have the opportunity to address queries.   Thank you.  Please document if the term "aspiration" can be further specified as   - Probable, Suspected or Known Aspiration Pneumonia  - Other condition  - Unable to clinically determine  Clinical Information: Repeat CXR report dated 09/04/15 "Worsening right lower lobe pneumonia. Followup PA and lateral chest X-ray is recommended in 3-4 weeks following trial of antibiotic therapy to ensure resolution and exclude underlying malignancy" per dictated report. Inpatient antibiotics include: Zithromax, Rocephin, Vancomycin Reported vomiting prior to admission per H&P   Please exercise your independent, professional judgment when responding. A specific answer is not anticipated or expected.   Thank You, Jerral Ralphathy R Tashe Purdon  RN BSN CCDS [phone] [employer] Information Management [place]

## 2015-09-07 DIAGNOSIS — R0602 Shortness of breath: Secondary | ICD-10-CM

## 2015-09-07 DIAGNOSIS — Z515 Encounter for palliative care: Secondary | ICD-10-CM

## 2015-09-07 DIAGNOSIS — R400 Somnolence: Secondary | ICD-10-CM

## 2015-09-07 LAB — BASIC METABOLIC PANEL WITH GFR
Anion gap: 7 (ref 5–15)
BUN: 20 mg/dL (ref 6–20)
CO2: 27 mmol/L (ref 22–32)
Calcium: 9 mg/dL (ref 8.9–10.3)
Chloride: 106 mmol/L (ref 101–111)
Creatinine, Ser: 0.86 mg/dL (ref 0.44–1.00)
GFR calc Af Amer: >60 mL/min
GFR calc non Af Amer: 58 mL/min — ABNORMAL LOW
Glucose, Bld: 108 mg/dL — ABNORMAL HIGH (ref 65–99)
Potassium: 4.1 mmol/L (ref 3.5–5.1)
Sodium: 140 mmol/L (ref 135–145)

## 2015-09-07 LAB — CBC
HCT: 32.1 % — ABNORMAL LOW (ref 36.0–46.0)
Hemoglobin: 10.4 g/dL — ABNORMAL LOW (ref 12.0–15.0)
MCH: 29.8 pg (ref 26.0–34.0)
MCHC: 32.4 g/dL (ref 30.0–36.0)
MCV: 92 fL (ref 78.0–100.0)
Platelets: 196 10*3/uL (ref 150–400)
RBC: 3.49 MIL/uL — ABNORMAL LOW (ref 3.87–5.11)
RDW: 13.7 % (ref 11.5–15.5)
WBC: 5.4 10*3/uL (ref 4.0–10.5)

## 2015-09-07 LAB — GLUCOSE, CAPILLARY
GLUCOSE-CAPILLARY: 92 mg/dL (ref 65–99)
Glucose-Capillary: 101 mg/dL — ABNORMAL HIGH (ref 65–99)
Glucose-Capillary: 138 mg/dL — ABNORMAL HIGH (ref 65–99)
Glucose-Capillary: 106 mg/dL — ABNORMAL HIGH (ref 65–99)
Glucose-Capillary: 177 mg/dL — ABNORMAL HIGH (ref 65–99)
Glucose-Capillary: 112 mg/dL — ABNORMAL HIGH (ref 65–99)

## 2015-09-07 NOTE — Progress Notes (Signed)
PROGRESS NOTE  Sonya Price  WUJ:[id number] DOB: [birth year] DOA: 09/03/2015 PCP: Michele Mcalpine, MD Outpatient Specialists:  Subjective: Seen with daughter at bedside, more alert, daughter wants to try some comfort feeding. We'll transfer to MedSurg.   Brief Narrative:  82 year old African-American female with DM 2, dementia, CAD and bedridden status came into the hospital because of lethargy, congestion and questionable aspiration. Urine culture showed UTI, currently afebrile likely can discharge in a.m. to home with home hospice service.  Assessment & Plan:   Principal Problem:   Sepsis (HCC) Active Problems:   ANEMIA   SENILE DEMENTIA   Essential hypertension   Diabetes mellitus type 2, controlled, with complications (HCC)   Atrial fibrillation (HCC)   Decubitus ulcer   SOB (shortness of breath)   Somnolence   Palliative care encounter   Sepsis with unclear source suspect urinary tract infection versus community-acquired pneumonia:  Patient with reported cough, congestion, decreased mentation.  CXR admission was clear, repeat CXR showed RLL infiltrates, right lower lobe CAP versus aspiration. Patient was started on sepsis protocol after being found to have elevated lactate acid of 3.5 with tachycardia and tachypnea. On vancomycin, azithromycin and ceftriaxone, vancomycin discontinued. 1/2 blood cultures showed GPC, coagulase-negative staph species likely contaminant.  Acute respiratory failure with hypoxia secondary to fluid overload:  Patient likely received too much fluid at once. O2 sats dropped to 70s necessitating a nonrebreather mask. Fluids status appears to be euvolemic, continue to hold Lasix.  UTI Urinalysis was not that impressive but showed bacteriuria and positive nitrite. Culture positive for pansensitive Escherichia coli, continue Rocephin.  Acute encephalopathy with increased lethargy:  Suspect that this could be secondary to acute infection. Continue to  monitor closely.  Atrial fibrillation: chadvasc score 5 not currently on any oral anticoagulants - Continue rate control with labetalol  - Labetalol IV Prn for now  Essential hypertension: Uncontrolled currently, but question cuff size - Continue amlodipine, labetalol   Diabetes mellitus type 2 - Check hemoglobin A1c  - held metformin - CBGs every before meals and at bedtime   Dementia - Continue Namenda and Aricept - Care order for patient be fed meals  Hypomagnesemia:  Patient's magnesium level 1.6 on admission, this is repleted with IV supplements.   Decubitus pressure ulcer likely stage II - Low air mattress  - Consult wound care   Goals of care The patient's son is coming from [place] today in the evening, patient likely will be able to discharge tomorrow with home hospice.   DVT prophylaxis: Lovenox Code Status: DNR Family Communication: Daughter at bedside. Disposition Plan: Transfer to MedSurg, likely can be discharged tomorrow Diet: Diet heart healthy/carb modified Room service appropriate?: Yes; Fluid consistency:: Thin  Consultants:   None  Procedures:   None  Antimicrobials:   None  Objective: Filed Vitals:   09/07/15 0600 09/07/15 0700 09/07/15 0800 09/07/15 0836  BP: 144/69 124/78    Pulse: 83 85    Temp:   98.6 F (37 C)   TempSrc:   Axillary   Resp: 19 14    Height:      Weight:      SpO2: 100% 100%  100%    Intake/Output Summary (Last 24 hours) at 09/07/15 1050 Last data filed at 09/07/15 0500  Gross per 24 hour  Intake    370 ml  Output    600 ml  Net   -230 ml   Filed Weights   09/04/15 0400  Weight: 68.8 kg (151 lb  10.8 oz)    Examination: General exam: Appears calm and comfortable  Respiratory system: Clear to auscultation. Respiratory effort normal. Cardiovascular system: S1 & S2 heard, RRR. No JVD, murmurs, rubs, gallops or clicks. No pedal edema. Gastrointestinal system: Abdomen is nondistended, soft and nontender.  No organomegaly or masses felt. Normal bowel sounds heard. Central nervous system: Alert and oriented. No focal neurological deficits. Extremities: Symmetric 5 x 5 power. Skin: No rashes, lesions or ulcers Psychiatry: Judgement and insight appear normal. Mood & affect appropriate.   Data Reviewed: I have personally reviewed following labs and imaging studies  CBC:  Recent Labs Lab 09/03/15 2136 09/04/15 0340 09/05/15 0336 09/06/15 0309 09/07/15 0321  WBC 8.8 6.9 8.5 6.9 5.4  NEUTROABS 6.9  --   --   --   --   HGB 13.5 11.3* 11.4* 10.9* 10.4*  HCT 39.8 34.9* 34.2* 32.7* 32.1*  MCV 89.8 92.3 90.0 90.6 92.0  PLT 199 170 160 181 196   Basic Metabolic Panel:  Recent Labs Lab 09/03/15 2136 09/03/15 2138 09/04/15 0340 09/05/15 0336 09/06/15 0309 09/07/15 0321  NA 135  --   --  138 137 140  K 4.3  --   --  3.4* 3.1* 4.1  CL 102  --   --  102 104 106  CO2 21*  --   --  24 25 27   GLUCOSE 215*  --   --  120* 90 108*  BUN 15  --   --  18 22* 20  CREATININE 0.82  --   --  1.12* 0.91 0.86  CALCIUM 9.0  --   --  8.3* 8.6* 9.0  MG  --  1.6* 2.0  --   --   --    GFR: Estimated Creatinine Clearance: 38.4 mL/min (by C-G formula based on Cr of 0.86). Liver Function Tests:  Recent Labs Lab 09/03/15 2136  AST 31  ALT 24  ALKPHOS 76  BILITOT 0.6  PROT 7.8  ALBUMIN 3.7    Recent Labs Lab 09/03/15 2136  LIPASE 31   No results for input(s): AMMONIA in the last 168 hours. Coagulation Profile: No results for input(s): INR, PROTIME in the last 168 hours. Cardiac Enzymes: No results for input(s): CKTOTAL, CKMB, CKMBINDEX, TROPONINI in the last 168 hours. BNP (last 3 results) No results for input(s): PROBNP in the last 8760 hours. HbA1C: No results for input(s): HGBA1C in the last 72 hours. CBG:  Recent Labs Lab 09/06/15 0744 09/06/15 1208 09/07/15 0037 09/07/15 0301 09/07/15 0534  GLUCAP 95 115* 138* 101* 106*   Lipid Profile: No results for input(s): CHOL, HDL,  LDLCALC, TRIG, CHOLHDL, LDLDIRECT in the last 72 hours. Thyroid Function Tests: No results for input(s): TSH, T4TOTAL, FREET4, T3FREE, THYROIDAB in the last 72 hours. Anemia Panel: No results for input(s): VITAMINB12, FOLATE, FERRITIN, TIBC, IRON, RETICCTPCT in the last 72 hours. Urine analysis:    Component Value Date/Time   COLORURINE YELLOW 09/03/2015 2319   APPEARANCEUR CLOUDY* 09/03/2015 2319   LABSPEC 1.023 09/03/2015 2319   PHURINE 5.0 09/03/2015 2319   GLUCOSEU 500* 09/03/2015 2319   GLUCOSEU 100 mg/dL* 04/26/7251 6644   HGBUR MODERATE* 09/03/2015 2319   BILIRUBINUR NEGATIVE 09/03/2015 2319   KETONESUR NEGATIVE 09/03/2015 2319   PROTEINUR 100* 09/03/2015 2319   UROBILINOGEN 1.0 04/06/2011 1500   NITRITE POSITIVE* 09/03/2015 2319   LEUKOCYTESUR NEGATIVE 09/03/2015 2319   Sepsis Labs: @LABRCNTIP (procalcitonin:4,lacticidven:4)  ) Recent Results (from the past 240 hour(s))  Blood culture (routine  x 2)     Status: Abnormal   Collection Time: 09/03/15  9:48 PM  Result Value Ref Range Status   Specimen Description BLOOD LEFT ARM  Final   Special Requests IN PEDIATRIC BOTTLE 1.5CC  Final   Culture  Setup Time   Final    ARPED GRAM POSITIVE COCCI IN CLUSTERS CRITICAL RESULT CALLED TO, READ BACK BY AND VERIFIED WITH: TO [id number]([id number] BY [facility] 09/04/2015 AT 10:09AM Organism ID to follow    Culture (A)  Final    STAPHYLOCOCCUS SPECIES (COAGULASE NEGATIVE) THE SIGNIFICANCE OF ISOLATING THIS ORGANISM FROM A SINGLE SET OF BLOOD CULTURES WHEN MULTIPLE SETS ARE DRAWN IS UNCERTAIN. PLEASE NOTIFY THE MICROBIOLOGY DEPARTMENT WITHIN ONE WEEK IF SPECIATION AND SENSITIVITIES ARE REQUIRED. Performed at [facility]    Report Status 09/05/2015 FINAL  Final  Blood culture (routine x 2)     Status: None (Preliminary result)   Collection Time: 09/03/15  9:50 PM  Result Value Ref Range Status   Specimen Description BLOOD LEFT HAND  Final   Special Requests BOTTLES DRAWN AEROBIC  AND ANAEROBIC 5CC  Final   Culture   Final    NO GROWTH 1 DAY Performed at [facility]    Report Status PENDING  Incomplete  Urine culture     Status: Abnormal   Collection Time: 09/03/15 11:19 PM  Result Value Ref Range Status   Specimen Description URINE, CATHETERIZED  Final   Special Requests NONE  Final   Culture 50,000 COLONIES/mL ESCHERICHIA COLI (A)  Final   Report Status 09/06/2015 FINAL  Final   Organism ID, Bacteria ESCHERICHIA COLI (A)  Final      Susceptibility   Escherichia coli - MIC*    AMPICILLIN 8 SENSITIVE Sensitive     CEFAZOLIN <=4 SENSITIVE Sensitive     CEFTRIAXONE <=1 SENSITIVE Sensitive     CIPROFLOXACIN <=0.25 SENSITIVE Sensitive     GENTAMICIN <=1 SENSITIVE Sensitive     IMIPENEM <=0.25 SENSITIVE Sensitive     NITROFURANTOIN <=16 SENSITIVE Sensitive     TRIMETH/SULFA <=20 SENSITIVE Sensitive     AMPICILLIN/SULBACTAM 4 SENSITIVE Sensitive     PIP/TAZO <=4 SENSITIVE Sensitive     * 50,000 COLONIES/mL ESCHERICHIA COLI  MRSA PCR Screening     Status: None   Collection Time: 09/04/15  4:11 AM  Result Value Ref Range Status   MRSA by PCR NEGATIVE NEGATIVE Final    Comment:        The GeneXpert MRSA Assay (FDA approved for NASAL specimens only), is one component of a comprehensive MRSA colonization surveillance program. It is not intended to diagnose MRSA infection nor to guide or monitor treatment for MRSA infections.      Invalid input(s): PROCALCITONIN, LACTICACIDVEN   Radiology Studies: No results found.      Scheduled Meds: . amLODipine  5 mg Oral Daily  . azithromycin  500 mg Intravenous Q24H  . cefTRIAXone (ROCEPHIN)  IV  1 g Intravenous Q24H  . donepezil  10 mg Oral Daily  . enoxaparin (LOVENOX) injection  40 mg Subcutaneous Q24H  . insulin aspart  0-9 Units Subcutaneous Q6H  . ipratropium  0.5 mg Nebulization TID  . labetalol  200 mg Oral BID  . levalbuterol  0.63 mg Nebulization TID  . memantine  10 mg Oral BID  .  metoprolol  10 mg Intravenous Q4H  . sodium chloride  1,000 mL Intravenous Once   Continuous Infusions:    LOS: 3 days  Time spent: 35 minutes    Mykle Pascua A, MD [facility] Pager [phone]  If 7PM-7AM, please contact night-coverage [email or web address] Password [email or web address] 09/07/2015, 10:50 AM

## 2015-09-07 NOTE — Evaluation (Signed)
SLP Cancellation Note  Patient Details Name: Irine SealRosa T Agudelo MRN: [id number] DOB: 1924/10/15   Cancelled treatment:       Reason Eval/Treat Not Completed: Other (comment) (note plan for comfort care only, SLP to sign off - MD reorder if desire.  thanks)   Donavan Burnetamara Malon Siddall, MS [facility] SLP [phone]

## 2015-09-08 ENCOUNTER — Telehealth: Payer: Self-pay | Admitting: Pulmonary Disease

## 2015-09-08 DIAGNOSIS — J690 Pneumonitis due to inhalation of food and vomit: Secondary | ICD-10-CM

## 2015-09-08 DIAGNOSIS — N390 Urinary tract infection, site not specified: Secondary | ICD-10-CM | POA: Insufficient documentation

## 2015-09-08 DIAGNOSIS — Z7189 Other specified counseling: Secondary | ICD-10-CM | POA: Insufficient documentation

## 2015-09-08 LAB — CBC
HCT: 29.5 % — ABNORMAL LOW (ref 36.0–46.0)
Hemoglobin: 9.9 g/dL — ABNORMAL LOW (ref 12.0–15.0)
MCH: 29.9 pg (ref 26.0–34.0)
MCHC: 33.6 g/dL (ref 30.0–36.0)
MCV: 89.1 fL (ref 78.0–100.0)
Platelets: 178 10*3/uL (ref 150–400)
RBC: 3.31 MIL/uL — ABNORMAL LOW (ref 3.87–5.11)
RDW: 13.4 % (ref 11.5–15.5)
WBC: 4.9 10*3/uL (ref 4.0–10.5)

## 2015-09-08 LAB — BASIC METABOLIC PANEL
Anion gap: 8 (ref 5–15)
BUN: 19 mg/dL (ref 6–20)
CO2: 23 mmol/L (ref 22–32)
Calcium: 8.6 mg/dL — ABNORMAL LOW (ref 8.9–10.3)
Chloride: 106 mmol/L (ref 101–111)
Creatinine, Ser: 0.86 mg/dL (ref 0.44–1.00)
GFR calc Af Amer: >60 mL/min (ref >60)
GFR calc non Af Amer: 58 mL/min — ABNORMAL LOW (ref >60)
Glucose, Bld: 189 mg/dL — ABNORMAL HIGH (ref 65–99)
Potassium: 4.2 mmol/L (ref 3.5–5.1)
Sodium: 137 mmol/L (ref 135–145)

## 2015-09-08 LAB — GLUCOSE, CAPILLARY
Glucose-Capillary: 160 mg/dL — ABNORMAL HIGH (ref 65–99)
Glucose-Capillary: 139 mg/dL — ABNORMAL HIGH (ref 65–99)

## 2015-09-08 MED ORDER — AMOXICILLIN-POT CLAVULANATE 250-62.5 MG/5ML PO SUSR: 500.0000 mg | Freq: Two times a day (BID) | ORAL | Status: AC

## 2015-09-08 MED ORDER — ATROPINE SULFATE 1 % OP SOLN: 2.0000 [drp] | Freq: Four times a day (QID) | OPHTHALMIC | Status: AC | PRN

## 2015-09-08 MED ORDER — LEVALBUTEROL HCL 0.63 MG/3ML IN NEBU: 0.6300 mg | INHALATION_SOLUTION | Freq: Three times a day (TID) | RESPIRATORY_TRACT | Status: AC

## 2015-09-08 NOTE — Discharge Summary (Signed)
Physician Discharge Summary  Sonya Price JME:[id number] DOB: 18-Aug-1924 DOA: 09/03/2015  PCP: Noralee Space, MD  Admit date: 09/03/2015 Discharge date: 09/08/2015  Time spent: 65 minutes  Recommendations for Outpatient Follow-up:  1. Patient will be discharged home with home hospice.   Discharge Diagnoses:  Principal Problem:   Sepsis ([place]) Active Problems:   ANEMIA   SENILE DEMENTIA   Essential hypertension   Diabetes mellitus type 2, controlled, with complications (HCC)   Atrial fibrillation (HCC)   Decubitus ulcer   SOB (shortness of breath)   Somnolence   Palliative care encounter   Goals of care, counseling/discussion   Discharge Condition: Stable  Diet recommendation: Comfort feeds  Filed Weights   09/04/15 0400  Weight: 68.8 kg (151 lb 10.8 oz)    History of present illness:  Per Dr Vista Deck Sonya Price is a 82 y.o. female with medical history significant of CAD, type 2 diabetes, dementia, diverticulosis, anemia, and nonambulatory; who presented with complaints of congestion and vomiting. Family including son, 2 daughters, and granddaughter provided history as patient was unable to do so secondary to dementia and lethargic. At baseline patient is nonambulatory and requires total care including feeding by one of her daughters during the week and then the son on the weekends. For the last 3 days the patient has not been doing well. She's had a change in her breathing and sounds more congested. She has what sounds like a productive cough, but family notes that she likely swallows secretions. Family denied any recent sick contacts to the knowledge. At baseline she is not on oxygen at home and has not received any recent antibiotics or been hospitalized. Symptoms appearred to worsen on the day of admission, with patient developing emesis and a having very loose bowel movement. Associated symptoms included increased lethargy although baseline patient sleeps a lot, but was not  responding like normal.They say that she's had a history of atrial fibrillation that was diagnosed 20 years ago, but was never placed on any oral anticoagulants. Family stated that the patient has a DO NOT RESUSCITATE order.   ED Course: Upon admission into the emergency department patient was evaluated and seen to be afebrile, with heart rates up to 132, respirations up to 35, and blood pressures of 170/107, O2 saturations maintained on nasal cannula O2. Initial blood work was relatively unremarkable except for elevated lactic acid 3.5, CO2 21, glucose 215. CT of the brain showed no acute abnormalities. Chest x-ray showed cardiomegaly, but no signs of acute infiltrate or edema. Patient was started on azithromycin and Rocephin for clinical history. Urinalysis also seen to be positive for many bacteria and nitrates. Patient was pancultured and [facility] called to admit.  Hospital Course:  Sepsis with unclear source suspect urinary tract infection versus community-acquired pneumonia:  Patient with reported cough, congestion, decreased mentation.  CXR admission was clear, repeat CXR showed RLL infiltrates, right lower lobe CAP versus aspiration. Patient was started on sepsis protocol after being found to have elevated lactate acid of 3.5 with tachycardia and tachypnea. On vancomycin, azithromycin and ceftriaxone, vancomycin discontinued. Patient was pancultured. 1/2 blood cultures showed GPC, coagulase-negative staph species likely contaminant. Urine cultures grew out Escherichia coli which was pansensitive. Antibiotic coverage was narrowed down to IV Rocephin and azithromycin. Patient improved clinically. Patient remained afebrile. Patient be discharged home on 3 more days of oral Augmentin to complete a course of antibiotic treatment. Patient was also seen in consultation by palliative care and goals of care  was shifted to more comfort approach. Patient will be discharged home with  hospice.  Acute respiratory failure with hypoxia secondary to fluid overload:  Patient during the hospitalization noted to be hypoxic and going to acute respiratory failure with hypoxia felt to be secondary to fluid overload. Patient's O2 sats dropped to 70s necessitating a nonrebreather mask. Patient was diuresed with clinical improvement. By day of discharge patient was satting 97% on room air. Patient noted to be euvolemic by day of discharge.   UTI Urinalysis was not that impressive but showed bacteriuria and positive nitrite. Culture positive for pansensitive Escherichia coli patient was maintained on IV Rocephin during the hospitalization. Patient be discharged home on 3 more days of oral Augmentin to complete a course of antibiotic treatment.  Acute metabolic encephalopathy with increased lethargy:  Suspect that this could be secondary to acute infection secondary to possible aspiration pneumonia UTI. Patient was monitored and followed and treated empirically with antibiotics. Palliative care was consulted and it was felt that patient had a poor prognosis. Patient was maintained as a DO NOT RESUSCITATE/DO NOT INTUBATE. Focus was shifted to more comfort approach and patient be discharged home with hospice.  Atrial fibrillation: chadvasc score 5 not currently on any oral anticoagulants - Patient was maintained on labetalol for rate control. Patient not on any oral anticoagulation. Patient be discharged home on hospice.   Essential hypertension: Uncontrolled currently, but question cuff size - Patient was maintained on home regimen of amlodipine, labetalol   Diabetes mellitus type 2 - Patient's hemoglobin A1c was noted to be 5.7 on 04/12/2015. Patient's oral hypoglycemic agents were held. Patient was maintained on sliding scale insulin.  Dementia - Continued on home regimen of Namenda and Aricept - Care order was placed for patient be fed meals  Hypomagnesemia:  Patient's magnesium  level 1.6 on admission, this is repleted with IV supplementation.   Decubitus pressure ulcer likely stage II - Low air mattress  - Consulted wound care   Goals of care Patient noted to have a poor prognosis and palliative care consultation was obtained. Family met with palliative care and goals of care were established. Patient was maintained as a DO NOT RESUSCITATE/DO NOT INTUBATE with no escalation in current care and patient was to decompensate. Goal was to maintain comfort moving forward and recommended that patient be discharged home with hospice. Patient subsequently will be discharged home with hospice.    Procedures:  CT head without contrast 09/03/2015  Chest x-ray 09/03/2015, 09/04/2015  2-D echo 09/04/2015  Consultations:  Palliative care: Dr. Domingo Cocking 09/04/2015  Discharge Exam: Filed Vitals:   09/08/15 0525 09/08/15 1301  BP:  123/70  Pulse:  95  Temp: 99 F (37.2 C) 98.6 F (37 C)  Resp:  16    General: Nonverbal.  Cardiovascular: RRR Respiratory: CTAB anterior lung fields  Discharge Instructions   Discharge Instructions    Diet general    Complete by:  As directed   Comfort feeds.     Increase activity slowly    Complete by:  As directed           Current Discharge Medication List    START taking these medications   Details  amoxicillin-clavulanate (AUGMENTIN) 250-62.5 MG/5ML suspension Take 10 mLs (500 mg total) by mouth 2 (two) times daily. Take for 3 days then stop. Qty: 150 mL, Refills: 0    atropine 1 % ophthalmic solution Place 2 drops under the tongue 4 (four) times daily as needed (oral secretions).  Qty: 15 mL, Refills: 3    levalbuterol (XOPENEX) 0.63 MG/3ML nebulizer solution Take 3 mLs (0.63 mg total) by nebulization 3 (three) times daily. Qty: 75 mL, Refills: 3      CONTINUE these medications which have NOT CHANGED   Details  amLODipine (NORVASC) 10 MG tablet TAKE 1/2 TABLET BY MOUTH ONCE DAILY Qty: 30 tablet, Refills: 2     donepezil (ARICEPT) 10 MG tablet TAKE 1 TABLET BY MOUTH ONCE A DAY Qty: 30 tablet, Refills: 6    labetalol (NORMODYNE) 200 MG tablet TAKE 1 TABLET BY MOUTH TWICE A DAY Qty: 60 tablet, Refills: 5    memantine (NAMENDA) 10 MG tablet Take 10 mg by mouth 2 (two) times daily.    metFORMIN (GLUCOPHAGE) 500 MG tablet Take 250 mg by mouth daily with breakfast.    traMADol (ULTRAM) 50 MG tablet TAKE 1 TABLET three times  A DAY AS NEEDED FOR ARTHRITIS PAIN Qty: 90 tablet, Refills: 5    oxyCODONE-acetaminophen (ROXICET) 5-325 MG/5ML solution Take 5 mLs by mouth every 6 (six) hours as needed for severe pain. Qty: 240 mL, Refills: 0      STOP taking these medications     potassium chloride (K-DUR) 10 MEQ tablet        Allergies  Allergen Reactions  . Atorvastatin     REACTION: increased myalgia/CPK  . Pioglitazone     REACTION: swelling   Follow-up Information    Please follow up.   Why:  f/u with hospice at home       The results of significant diagnostics from this hospitalization (including imaging, microbiology, ancillary and laboratory) are listed below for reference.    Significant Diagnostic Studies: Dg Chest 2 View  09/04/2015  CLINICAL DATA:  82 year old female with history of shortness of breath. Diabetes. CAD. EXAM: CHEST  2 VIEW COMPARISON:  Chest x-ray 09/03/2015. FINDINGS: Interval worsening of multifocal airspace disease throughout the right lung a which appears to predominantly involve the right lower lobe (including the superior segment of the right lower lobe), concerning for worsening pneumonia. Multiple air bronchograms are noted throughout this region. Left lung is clear. No pleural effusions. No evidence of pulmonary edema. Mild cardiomegaly. Upper mediastinal contours are distorted by patient's rotation the right. IMPRESSION: 1. Worsening right lower lobe pneumonia. Followup PA and lateral chest X-ray is recommended in 3-4 weeks following trial of antibiotic  therapy to ensure resolution and exclude underlying malignancy. 2. Cardiomegaly. Electronically Signed   By: Vinnie Langton M.D.   On: 09/04/2015 14:32   Dg Chest 2 View  09/03/2015  CLINICAL DATA:  Cough and chest congestion for 2 days. Nausea, vomiting, and diarrhea beginning today. EXAM: CHEST  2 VIEW COMPARISON:  04/21/2008 FINDINGS: Mild cardiomegaly and ectasia of the thoracic aorta remains stable. Both lungs are well aerated and clear. No evidence of pneumothorax or pleural effusion. IMPRESSION: Stable cardiomegaly.  No active lung disease. Electronically Signed   By: Earle Gell M.D.   On: 09/03/2015 19:30   Ct Head Wo Contrast  09/03/2015  CLINICAL DATA:  82 year old female with altered mental status. EXAM: CT HEAD WITHOUT CONTRAST TECHNIQUE: Contiguous axial images were obtained from the base of the skull through the vertex without intravenous contrast. COMPARISON:  CT dated 04/21/2008 FINDINGS: There is moderate-to-severe age-related atrophy similar or slightly advanced compared to the prior study. Periventricular and deep white matter chronic microvascular ischemic changes noted. There is no acute intracranial hemorrhage. No mass effect or midline shift noted. The  visualized paranasal sinuses and mastoid air cells are clear. The calvarium is intact. IMPRESSION: No acute intracranial hemorrhage. Advanced age-related atrophy and chronic microvascular ischemic disease. If symptoms persist and there are no contraindications, MRI may provide better evaluation if clinically indicated. Electronically Signed   By: Anner Crete M.D.   On: 09/03/2015 20:24    Microbiology: Recent Results (from the past 240 hour(s))  Blood culture (routine x 2)     Status: Abnormal   Collection Time: 09/03/15  9:48 PM  Result Value Ref Range Status   Specimen Description BLOOD LEFT ARM  Final   Special Requests IN PEDIATRIC BOTTLE 1.5CC  Final   Culture  Setup Time   Final    ARPED GRAM POSITIVE COCCI IN  CLUSTERS CRITICAL RESULT CALLED TO, READ BACK BY AND VERIFIED WITH: TO AKROLICZAK([id number] BY [facility] 09/04/2015 AT 10:09AM Organism ID to follow    Culture (A)  Final    STAPHYLOCOCCUS SPECIES (COAGULASE NEGATIVE) THE SIGNIFICANCE OF ISOLATING THIS ORGANISM FROM A SINGLE SET OF BLOOD CULTURES WHEN MULTIPLE SETS ARE DRAWN IS UNCERTAIN. PLEASE NOTIFY THE MICROBIOLOGY DEPARTMENT WITHIN ONE WEEK IF SPECIATION AND SENSITIVITIES ARE REQUIRED. Performed at [facility]    Report Status 09/05/2015 FINAL  Final  Blood culture (routine x 2)     Status: None (Preliminary result)   Collection Time: 09/03/15  9:50 PM  Result Value Ref Range Status   Specimen Description BLOOD LEFT HAND  Final   Special Requests BOTTLES DRAWN AEROBIC AND ANAEROBIC 5CC  Final   Culture   Final    NO GROWTH 3 DAYS Performed at [facility]    Report Status PENDING  Incomplete  Urine culture     Status: Abnormal   Collection Time: 09/03/15 11:19 PM  Result Value Ref Range Status   Specimen Description URINE, CATHETERIZED  Final   Special Requests NONE  Final   Culture 50,000 COLONIES/mL ESCHERICHIA COLI (A)  Final   Report Status 09/06/2015 FINAL  Final   Organism ID, Bacteria ESCHERICHIA COLI (A)  Final      Susceptibility   Escherichia coli - MIC*    AMPICILLIN 8 SENSITIVE Sensitive     CEFAZOLIN <=4 SENSITIVE Sensitive     CEFTRIAXONE <=1 SENSITIVE Sensitive     CIPROFLOXACIN <=0.25 SENSITIVE Sensitive     GENTAMICIN <=1 SENSITIVE Sensitive     IMIPENEM <=0.25 SENSITIVE Sensitive     NITROFURANTOIN <=16 SENSITIVE Sensitive     TRIMETH/SULFA <=20 SENSITIVE Sensitive     AMPICILLIN/SULBACTAM 4 SENSITIVE Sensitive     PIP/TAZO <=4 SENSITIVE Sensitive     * 50,000 COLONIES/mL ESCHERICHIA COLI  MRSA PCR Screening     Status: None   Collection Time: 09/04/15  4:11 AM  Result Value Ref Range Status   MRSA by PCR NEGATIVE NEGATIVE Final    Comment:        The GeneXpert MRSA Assay (FDA approved  for NASAL specimens only), is one component of a comprehensive MRSA colonization surveillance program. It is not intended to diagnose MRSA infection nor to guide or monitor treatment for MRSA infections.      Labs: Basic Metabolic Panel:  Recent Labs Lab 09/03/15 2136 09/03/15 2138 09/04/15 0340 09/05/15 0336 09/06/15 0309 09/07/15 0321 09/08/15 0340  NA 135  --   --  138 137 140 137  K 4.3  --   --  3.4* 3.1* 4.1 4.2  CL 102  --   --  102 104 106  106  CO2 21*  --   --  _0 GLUCOSE 215*  --   --  120* 90 108* 189*  BUN 15  --   --  18 22* 20 19  CREATININE 0.82  --   --  1.12* 0.91 0.86 0.86  CALCIUM 9.0  --   --  8.3* 8.6* 9.0 8.6*  MG  --  1.6* 2.0  --   --   --   --    Liver Function Tests:  Recent Labs Lab 09/03/15 2136  AST 31  ALT 24  ALKPHOS 76  BILITOT 0.6  PROT 7.8  ALBUMIN 3.7    Recent Labs Lab 09/03/15 2136  LIPASE 31   No results for input(s): AMMONIA in the last 168 hours. CBC:  Recent Labs Lab 09/03/15 2136 09/04/15 0340 09/05/15 0336 09/06/15 0309 09/07/15 0321 09/08/15 0340  WBC 8.8 6.9 8.5 6.9 5.4 4.9  NEUTROABS 6.9  --   --   --   --   --   HGB 13.5 11.3* 11.4* 10.9* 10.4* 9.9*  HCT 39.8 34.9* 34.2* 32.7* 32.1* 29.5*  MCV 89.8 92.3 90.0 90.6 92.0 89.1  PLT 199 170 160 181 196 178   Cardiac Enzymes: No results for input(s): CKTOTAL, CKMB, CKMBINDEX, TROPONINI in the last 168 hours. BNP: BNP (last 3 results) No results for input(s): BNP in the last 8760 hours.  ProBNP (last 3 results) No results for input(s): PROBNP in the last 8760 hours.  CBG:  Recent Labs Lab 09/07/15 1245 09/07/15 1819 09/07/15 2317 09/08/15 0452 09/08/15 1255  GLUCAP 177* 92 112* 160* 139*       Signed:  Einar Nolasco MD.  [facility] 09/08/2015, 1:20 PM

## 2015-09-08 NOTE — Care Management Important Message (Signed)
Important Message  Patient Details  Name: Sonya Price MRN: [id number] Date of Birth: 06-21-1924   Medicare Important Message Given:  Yes    Haskell FlirtJamison, Oriana Horiuchi 09/08/2015, 9:08 AMImportant Message  Patient Details  Name: Sonya Price MRN: [id number] Date of Birth: 06-21-1924   Medicare Important Message Given:  Yes    Haskell FlirtJamison, Rashed Edler 09/08/2015, 9:08 AM

## 2015-09-08 NOTE — Progress Notes (Signed)
Notified by Caroline SaugerNora, CMRN of family request [facility] after [facility]. Chart and patient information currently under review to confirm hospice eligibility.   Spoke with family including [facility], Adela GlimpseBernard at bedside to initiate education related to hospice philosophy, services and team approach to care. Family verbalized understanding of the information provided. Per discussion, plan is for discharge to home by [facility] today.   Please send signed completed DNR form home with patient.   DME needs discussed and family requested oxygen set-up, suction and nebulizer machine for delivery to the home today.  [facility] equipment manager Jewel Kizzie BaneHughes notified and will contact [facility] to arrange delivery to the home.  The home address has been verified and is correct in the chart; Adela GlimpseBernard is family member to be contacted to arrange time of delivery @ [phone].   [facility] aware of the above.  Completed discharge summary will need to [facility] at [phone] when final.  [facility] information and contact numbers have been given to [facility] during visit.   Please call with any questions.  Thank You,  Hessie KnowsStacie Wilkinson RN, BSN  [facility] Hospital Liaison  [phone]

## 2015-09-08 NOTE — Progress Notes (Signed)
Family requesting ambulance transport for pt to dc home.  Medical transport form filled out and printed along with demographics sheet.  Paperwork left with staff RN along with [facility] number to call for transport when family ready and discharge papers done.  No other CM needs communicated. Sandford Crazeora Elbia Paro RN,BSN,NCM [phone]

## 2015-09-08 NOTE — Care Management Note (Addendum)
Case Management Note  Patient Details  Name: Sonya Price MRN: [id number] Date of Birth: 1924/04/25  Subjective/Objective:      82 yo admitted with Sepsis.              Action/Plan: From home with daughter. To DC home with home hospice services.  This CM met with family at bedside to offer choice for home hospice.  [facility] chosen for home hospice services.  [facility] rep contacted for referral. Pt states only equipment they would need for home would be potentially suction.  This information was relayed to the [facility] rep.  Pt will also need ambulance transport home.  CM will continue to follow.  Expected Discharge Date:  09/14/15               Expected Discharge Plan:  Home w Hospice Care  In-House Referral:     Discharge planning Services  CM Consult  Post Acute Care Choice:  NA, Hospice Choice offered to:  Adult Children  DME Arranged:    DME Agency:     HH Arranged:  Disease Management [place] Agency:  [facility]  Status of Service:  In process, will continue to follow  Medicare Important Message Given:  Yes Date Medicare IM Given:    Medicare IM give by:    Date Additional Medicare IM Given:    Additional Medicare Important Message give by:     If discussed at [place] of Stay Meetings, dates discussed:    Additional CommentsLynnell Catalan, RN 09/08/2015, 11:00 AM [phone]

## 2015-09-08 NOTE — Progress Notes (Signed)
Daily Progress Note   Patient Name: Sonya Price       Date: 09/08/2015 DOB: January 02, 1925  Age: 82 y.o. MRN#: [id number] Attending Physician: Eugenie Filler, MD Primary Care Physician: Noralee Space, MD Admit Date: 09/03/2015  Reason for Consultation/Follow-up: Establishing goals of care  Subjective: Met today with Sonya Price sons and daughter in her room by her bedside. Sonya Price.    Patient appears comfortable but still remains nonverbal and no other significant interaction with family. Discussed about D/C options, patient lives at home with her son. Family is agreeable to Hospice. It is noted that plan is for home with hospice, I wonder if the patient is appropriate for residential hospice, request case manager consultation to discuss about hospice and to establish appropriate D/C planning. Patient ready for d.c today.    Length of Stay: 4  Current Medications: Scheduled Meds:  . amLODipine  5 mg Oral Daily  . azithromycin  500 mg Intravenous Q24H  . cefTRIAXone (ROCEPHIN)  IV  1 g Intravenous Q24H  . donepezil  10 mg Oral Daily  . enoxaparin (LOVENOX) injection  40 mg Subcutaneous Q24H  . insulin aspart  0-9 Units Subcutaneous Q6H  . ipratropium  0.5 mg Nebulization TID  . labetalol  200 mg Oral BID  . levalbuterol  0.63 mg Nebulization TID  . memantine  10 mg Oral BID  . metoprolol  10 mg Intravenous Q4H  . sodium chloride  1,000 mL Intravenous Once    Continuous Infusions:    PRN Meds: acetaminophen **OR** acetaminophen, atropine, haloperidol lactate, hydrALAZINE, labetalol, LORazepam, morphine injection, ondansetron **OR** ondansetron (ZOFRAN) IV, traMADol  Physical Exam         General: Somnolent, in no acute distress. Does not arouse to verbal or tactile  stimulation HEENT: No bruits, no goiter, no JVD Heart: Tachycardic. No murmur appreciated. Lungs: Fair air movement, scattered coarse breath sounds Abdomen: Soft, nontender, nondistended, positive bowel sounds.  Ext: No significant edema Skin: Warm and dry Neuro: Unable to assess  Vital Signs: BP 116/50 mmHg  Pulse 80  Temp(Src) 99 F (37.2 C) (Axillary)  Resp 19  Ht 5' (1.524 m)  Wt 68.8 kg (151 lb 10.8 oz)  BMI 29.62 kg/m2  SpO2 100% SpO2: SpO2: 100 % O2 Device: O2 Device: Nasal Cannula O2 Flow Rate: O2  Flow Rate (L/min): 2 L/min  Intake/output summary:   Intake/Output Summary (Last 24 hours) at 09/08/15 0739 Last data filed at 09/08/15 0524  Gross per 24 hour  Intake      0 ml  Output    575 ml  Net   -575 ml   LBM: Last BM Date: 09/07/15 Baseline Weight: Weight: 68.8 kg (151 lb 10.8 oz) Most recent weight: Weight: 68.8 kg (151 lb 10.8 oz)       Palliative Assessment/Data: PPS 10%   Flowsheet Rows        Most Recent Value   Intake Tab    Referral Department  Hospitalist   Unit at Time of Referral  ICU   Palliative Care Primary Diagnosis  Sepsis/Infectious Disease   Date Notified  09/03/15   Palliative Care Type  New Palliative care   Date of Admission  09/04/15   Date first seen by Palliative Care  09/04/15   # of days Palliative referral response time  1 Day(s)   # of days IP prior to Palliative referral  -1   Clinical Assessment    Palliative Performance Scale Score  10%   Pain Max last 24 hours  Not able to report   Pain Min Last 24 hours  Not able to report   Psychosocial & Spiritual Assessment    Palliative Care Outcomes    Patient/Family meeting held?  Yes   Who was at the meeting?  4 of patient's children and 1 grandchild   Palliative Care Outcomes  Improved pain interventions, Improved non-pain symptom therapy, Clarified goals of care      Patient Active Problem List   Diagnosis Date Noted  . SOB (shortness of breath)   . Somnolence   .  Palliative care encounter   . Sepsis ([place]) 09/04/2015  . Atrial fibrillation ([place]) 09/04/2015  . Decubitus ulcer 09/04/2015  . Diabetes mellitus type 2, controlled, with complications ([place]) 31/51/7616  . Postmenopausal bleeding 04/26/2011  . BELL'S PALSY, RIGHT 05/01/2008  . URINARY INCONTINENCE 01/03/2008  . SENILE DEMENTIA 07/16/2007  . Diverticulosis of large intestine 07/16/2007  . Disorder resulting from impaired renal function 07/16/2007  . HYPERCHOLESTEROLEMIA 03/13/2007  . ANEMIA 03/13/2007  . Essential hypertension 03/13/2007  . Coronary atherosclerosis 03/13/2007  . Transient cerebral ischemia 03/13/2007  . Diaphragmatic hernia 03/13/2007  . IRRITABLE BOWEL SYNDROME 03/13/2007  . Osteoarthritis 03/13/2007  . BACK PAIN, LUMBAR 03/13/2007  . SHINGLES, HX OF 03/13/2007    Palliative Care Assessment & Plan   Patient Profile: 82 year old African-American female with history of dementia, diabetes, coronary artery disease who is bedridden and present to the hospital with increased lethargy and sepsis likely secondary to suspected aspiration pneumonia  Recommendations/Plan: - Patient is DO NOT RESUSCITATE/DO NOT INTUBATE with no escalation in current care in the event she decompensates. -  She has medications for use as needed in case she has additional needs to maintain her comfort moving forward.  Goals of Care and Additional Recommendations:  Limitations on Scope of Treatment: Continue current therapy no escalation of care she were to decompensate  Code Status:    Code Status Orders        Start     Ordered   09/04/15 0058  Do not attempt resuscitation (DNR)   Continuous    Question Answer Comment  In the event of cardiac or respiratory ARREST Do not call a "code blue"   In the event of cardiac or respiratory ARREST Do not perform Intubation,  CPR, defibrillation or ACLS   In the event of cardiac or respiratory ARREST Use medication by any route, position, wound  care, and other measures to relive pain and suffering. May use oxygen, suction and manual treatment of airway obstruction as needed for comfort.      09/04/15 0104    Code Status History    Date Active Date Inactive Code Status Order ID Comments User Context   This patient has a current code status but no historical code status.    Advance Directive Documentation        Most Recent Value   Type of Advance Directive  Healthcare Power of Attorney, Living will   Pre-existing out of facility DNR order (yellow form or pink MOST form)     "MOST" Form in Place?         Prognosis:  Days to ?less than 2 weeks at this time.    Discharge Planning:  Residential hospice versus home with hospice  Care plan was discussed with Patient's sons ( one son has arrived from [place] last night) and daughter.   Thank you for allowing the Palliative Medicine Team to assist in the care of this patient.   Time In: 7 Time Out: 725 Total Time 25 Prolonged Time Billed No      Greater than 50%  of this time was spent counseling and coordinating care related to the above assessment and plan.  Loistine Chance, MD [phone]  Please contact Palliative Medicine Team phone at [phone] for questions and concerns.

## 2015-09-08 NOTE — Telephone Encounter (Signed)
[facility]. Pt is being d/c'd from [facility] today and they need to know who will be attending physician. SN is still listed as PCP. Per chart pt has not seen anyone else for PCP care. Spoke with SN and he states that he agrees to be attending with the assistance of Hospice physicians when he is not available. Spoke with [place] and relayed this information. Nothing further needed.

## 2015-09-09 ENCOUNTER — Telehealth: Payer: Self-pay | Admitting: Pulmonary Disease

## 2015-09-09 LAB — CULTURE, BLOOD (ROUTINE X 2): CULTURE: NO GROWTH

## 2015-09-09 NOTE — Telephone Encounter (Signed)
Per SN, he is fine to sign any orders that Hospice would need signed. He again reiterated that he would like to defer as much as possible to the Hospice physicians, so I relayed that information to [place]. She said that was fine. Nothing further needed at this time.

## 2015-09-09 NOTE — Telephone Encounter (Signed)
[facility] and she states that Hospice now has a Audiological scientisttanding Order Policy for all patients. They would like to make sure that you are aware and on board. They will send orders out via the portal and they will need to be signed and returned via portal. They only need a verbal confirmation of your approval at this time.   SN, please advise. Thanks!

## 2015-09-10 ENCOUNTER — Telehealth: Payer: Self-pay | Admitting: Pulmonary Disease

## 2015-09-10 NOTE — Telephone Encounter (Signed)
Noted  

## 2015-09-27 ENCOUNTER — Telehealth: Payer: Self-pay | Admitting: Pulmonary Disease

## 2015-09-27 NOTE — Telephone Encounter (Signed)
l[facility].

## 2015-09-28 MED ORDER — GLIMEPIRIDE 1 MG PO TABS: 1.0000 mg | ORAL_TABLET | Freq: Every day | ORAL | Status: DC

## 2015-09-28 NOTE — Telephone Encounter (Signed)
[facility] - Blood sugar has been running on the high side - last checked at 231 Metformin is 500mg  qam - wanting to know if Metformin needs to be increased.  Pt only eats 2 meals a days - pureed veggies, apple sauce, pudding -- 4-5cups a day.    Please advise Dr Kriste BasqueNadel. Thanks.

## 2015-09-28 NOTE — Telephone Encounter (Signed)
Per SN: Do not increase Metformin due to risk of Diarrhea; let's add Glimepiride 1 mg #30 take 1 po QAM with 2 refills. Thanks.

## 2015-09-28 NOTE — Telephone Encounter (Signed)
Spoke with Sonya Price. She is aware of SN's recommendation. Rx has been sent in. Nothing further was needed.

## 2015-10-06 ENCOUNTER — Other Ambulatory Visit: Payer: Self-pay | Admitting: Pulmonary Disease

## 2015-10-11 ENCOUNTER — Ambulatory Visit: Payer: Medicare Other | Admitting: Pulmonary Disease

## 2015-12-27 ENCOUNTER — Other Ambulatory Visit: Payer: Self-pay | Admitting: Pulmonary Disease

## 2016-03-22 ENCOUNTER — Other Ambulatory Visit: Payer: Self-pay | Admitting: Pulmonary Disease

## 2016-04-22 ENCOUNTER — Other Ambulatory Visit: Payer: Self-pay | Admitting: Pulmonary Disease

## 2016-05-28 ENCOUNTER — Other Ambulatory Visit: Payer: Self-pay | Admitting: Pulmonary Disease

## 2016-06-14 ENCOUNTER — Telehealth: Payer: Self-pay | Admitting: Pulmonary Disease

## 2016-06-14 NOTE — Telephone Encounter (Signed)
Order has been received. SN is not here this AM yet. I have left a message with V[facility] BergerShavonne letting her know that we have receive this fax and will have SN sign ASAP.

## 2016-06-15 NOTE — Telephone Encounter (Signed)
Orders have been faxed back.

## 2016-06-24 ENCOUNTER — Other Ambulatory Visit: Payer: Self-pay | Admitting: Pulmonary Disease

## 2016-08-17 ENCOUNTER — Telehealth: Payer: Self-pay | Admitting: Pulmonary Disease

## 2016-08-17 NOTE — Telephone Encounter (Signed)
Will forward to SN to make him aware.   

## 2016-08-21 ENCOUNTER — Telehealth: Payer: Self-pay

## 2016-08-21 NOTE — Telephone Encounter (Signed)
On 2016-09-09 I received a death certificate from [employer] (original). The death certificate is for burial. The patient is a patient of Doctor Kriste Basque. The death certificate will be taken to Pulmonary Unit @ [facility] this am for signature. On 2016/09/09 I received the death certificate back from Doctor Kriste Basque. I got the death certificate ready and called the funeral home to let them know the death certificate is ready for pickup.

## 2016-08-22 DEATH — deceased
